# Patient Record
Sex: Female | Born: 1950
Health system: Southern US, Community
[De-identification: ages and names within clinical notes are randomized; demographics above are authoritative.]

## PROBLEM LIST (undated history)

## (undated) DIAGNOSIS — F32A Depression, unspecified: Secondary | ICD-10-CM

## (undated) DIAGNOSIS — R519 Headache, unspecified: Secondary | ICD-10-CM

## (undated) DIAGNOSIS — Z789 Other specified health status: Secondary | ICD-10-CM

## (undated) DIAGNOSIS — Z8489 Family history of other specified conditions: Secondary | ICD-10-CM

## (undated) DIAGNOSIS — F329 Major depressive disorder, single episode, unspecified: Secondary | ICD-10-CM

## (undated) DIAGNOSIS — M1990 Unspecified osteoarthritis, unspecified site: Secondary | ICD-10-CM

## (undated) DIAGNOSIS — C801 Malignant (primary) neoplasm, unspecified: Secondary | ICD-10-CM

## (undated) DIAGNOSIS — Z87442 Personal history of urinary calculi: Secondary | ICD-10-CM

## (undated) DIAGNOSIS — R51 Headache: Secondary | ICD-10-CM

## (undated) HISTORY — PX: KNEE ARTHROSCOPY: SUR90

## (undated) HISTORY — DX: Major depressive disorder, single episode, unspecified: F32.9

## (undated) HISTORY — DX: Depression, unspecified: F32.A

## (undated) HISTORY — PX: DILATION AND CURETTAGE OF UTERUS: SHX78

## (undated) HISTORY — PX: BACK SURGERY: SHX140

---

## 1998-11-07 ENCOUNTER — Other Ambulatory Visit: Admission: RE | Admit: 1998-11-07 | Discharge: 1998-11-07 | Payer: Self-pay | Admitting: Gynecology

## 2000-05-23 ENCOUNTER — Other Ambulatory Visit: Admission: RE | Admit: 2000-05-23 | Discharge: 2000-05-23 | Payer: Self-pay | Admitting: Gynecology

## 2000-06-21 ENCOUNTER — Other Ambulatory Visit: Admission: RE | Admit: 2000-06-21 | Discharge: 2000-06-21 | Payer: Self-pay | Admitting: Gynecology

## 2000-06-21 ENCOUNTER — Encounter (INDEPENDENT_AMBULATORY_CARE_PROVIDER_SITE_OTHER): Payer: Self-pay | Admitting: Specialist

## 2001-12-31 ENCOUNTER — Other Ambulatory Visit: Admission: RE | Admit: 2001-12-31 | Discharge: 2001-12-31 | Payer: Self-pay | Admitting: Gynecology

## 2002-10-12 ENCOUNTER — Encounter: Admission: RE | Admit: 2002-10-12 | Discharge: 2002-10-12 | Payer: Self-pay | Admitting: Family Medicine

## 2002-10-12 ENCOUNTER — Encounter: Payer: Self-pay | Admitting: Family Medicine

## 2003-08-26 ENCOUNTER — Other Ambulatory Visit: Admission: RE | Admit: 2003-08-26 | Discharge: 2003-08-26 | Payer: Self-pay | Admitting: Gynecology

## 2003-12-04 ENCOUNTER — Emergency Department (HOSPITAL_COMMUNITY): Admission: EM | Admit: 2003-12-04 | Discharge: 2003-12-04 | Payer: Self-pay | Admitting: *Deleted

## 2004-09-05 ENCOUNTER — Other Ambulatory Visit: Admission: RE | Admit: 2004-09-05 | Discharge: 2004-09-05 | Payer: Self-pay | Admitting: Gynecology

## 2005-09-25 ENCOUNTER — Other Ambulatory Visit: Admission: RE | Admit: 2005-09-25 | Discharge: 2005-09-25 | Payer: Self-pay | Admitting: Family Medicine

## 2005-12-09 IMAGING — CT CT ABDOMEN W/O CM
1 of 2 series · 15 of 32 positions shown, 19 images · non-contrast
Comparison: none

CLINICAL DATA: Pain.  Question kidney stones.
 CT ABDOMEN WITHOUT CONTRAST ? STONE PROTOCOL
 Multidetector helical study performed without IV and without oral contrast with a renal stone protocol.  
 There is moderate right hydronephrosis.  There are two small 1 to 2 mm sized mid and lower pole left renal calculi noted.  There are no intrarenal right renal calculi.  The abdominal aorta is normal in size.   The remainder of the CT of the abdomen is negative.
 IMPRESSION 
 Moderate right hydronephrosis.   Please see CT pelvis report.
 Small left intrarenal renal calculi.
 CT PELVIS WITHOUT CONTRAST ? STONE PROTOCOL
 There is a 4 mm in size distal right ureteral calculus located at the right ureterovesical junction.  Also seen is a 4 cm in size cyst associated with the left ovary.  This is not well evaluated in terms of simple versus complex cyst and I would recommend transvaginal ultrasound in further evaluation of this to determine if this represents a simple or complex cyst.  Sigmoid colonic diverticulosis noted without evidence for diverticulitis.
 4 mm in size distal right ureteral calculus located at the right ureterovesical junction producing moderate right hydronephrosis.
 4 cm in size left ovarian cyst.  Recommend transvaginal pelvic ultrasound in further evaluation of this lesion.
 Sigmoid colonic diverticulosis.  Please see CT abdomen report as well.

[Series 3: renal stone · axial · 0.66mm/px · z∈[-418,-58]mm · 15 of 75 slices shown, 19 images]
[im 3/75  soft-tissue]
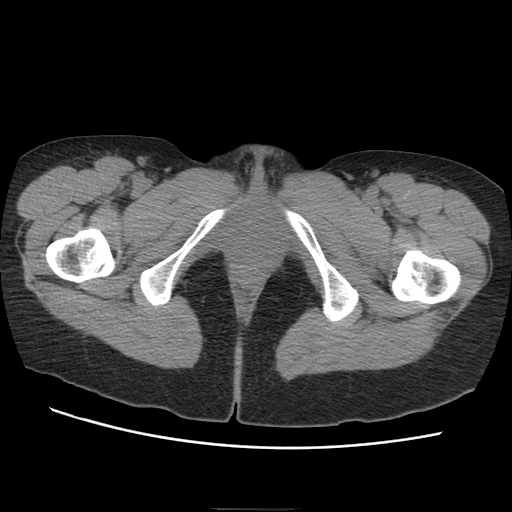
[im 3/75  bone]
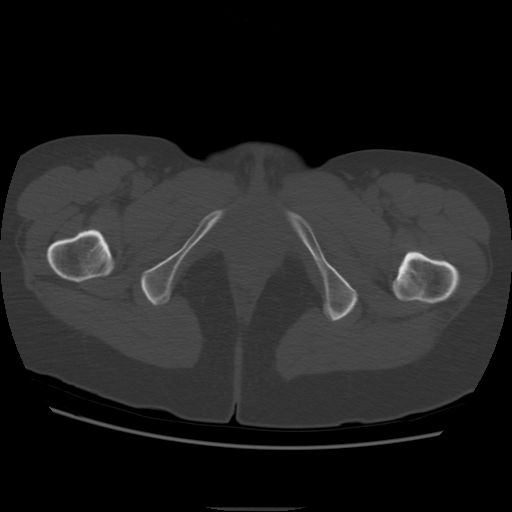
[im 9/75  soft-tissue]
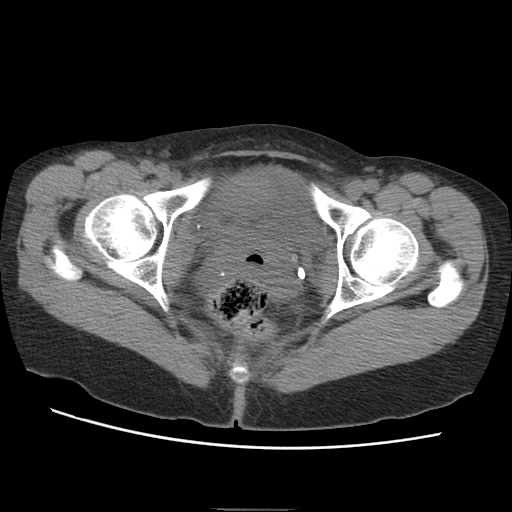
[im 15/75  soft-tissue]
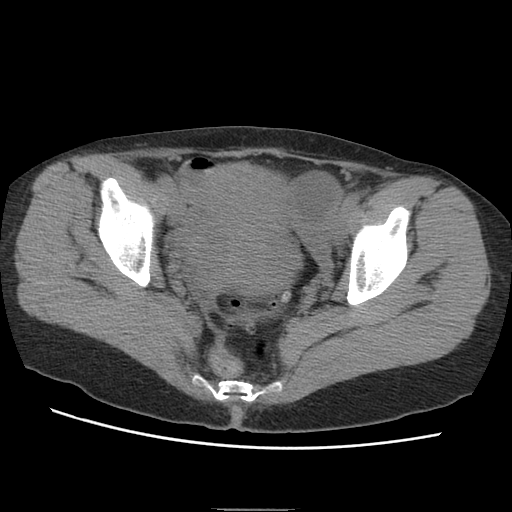
[im 21/75  soft-tissue]
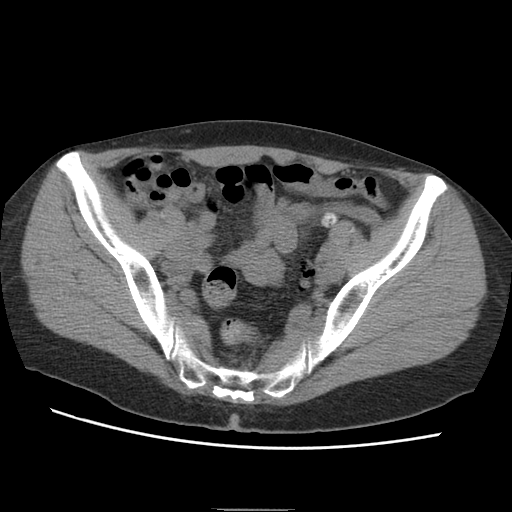
[im 27/75  soft-tissue]
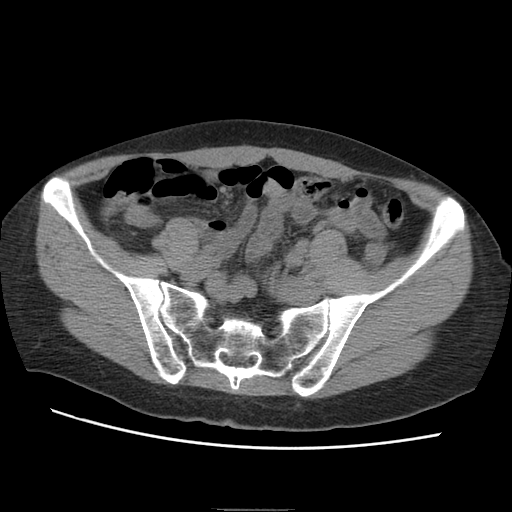
[im 33/75  soft-tissue]
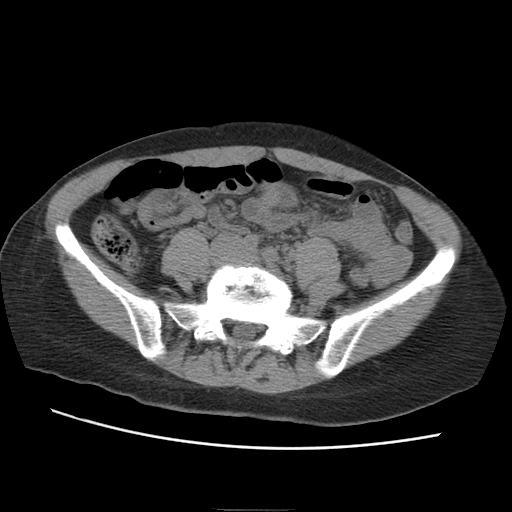
[im 39/75  soft-tissue]
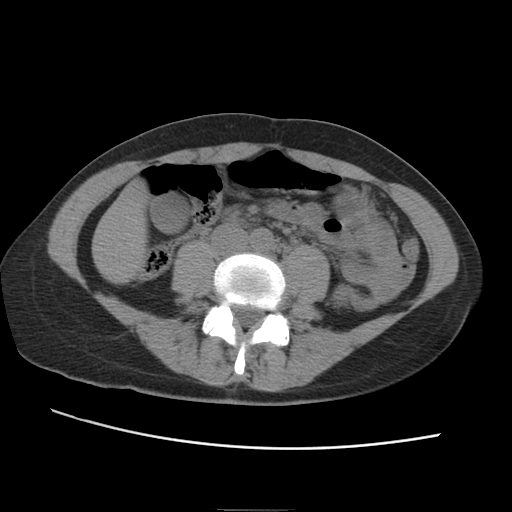
[im 42/75  soft-tissue]
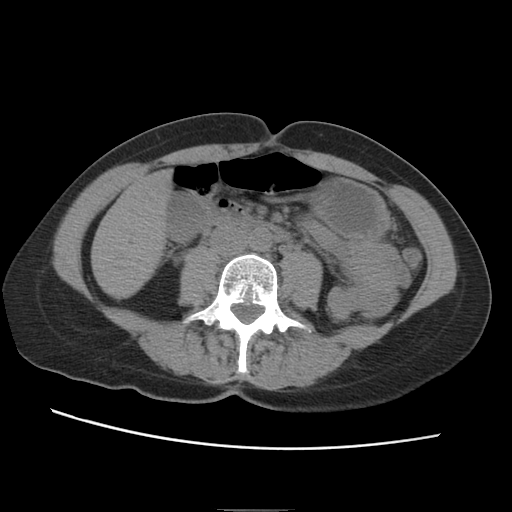
[im 48/75  soft-tissue]
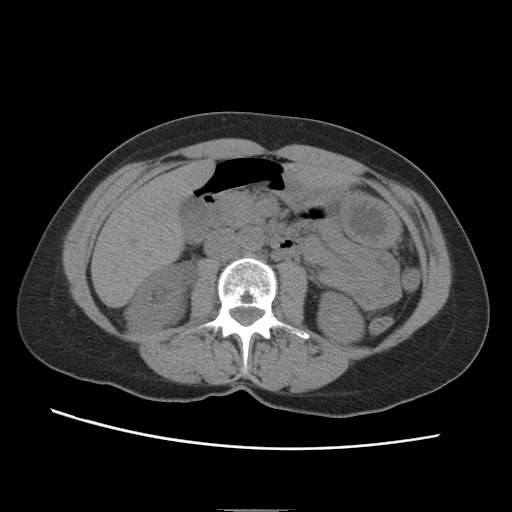
[im 48/75  bone]
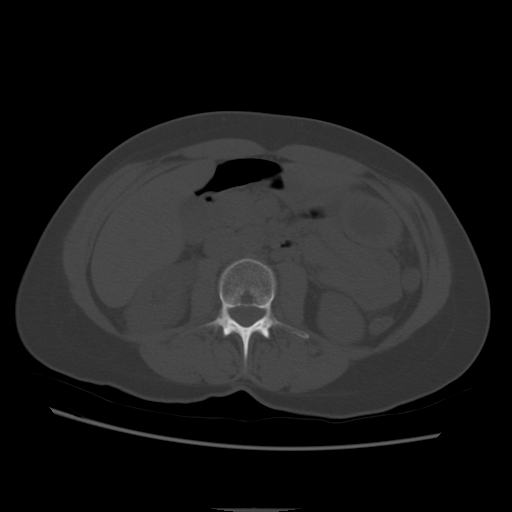
[im 54/75  soft-tissue]
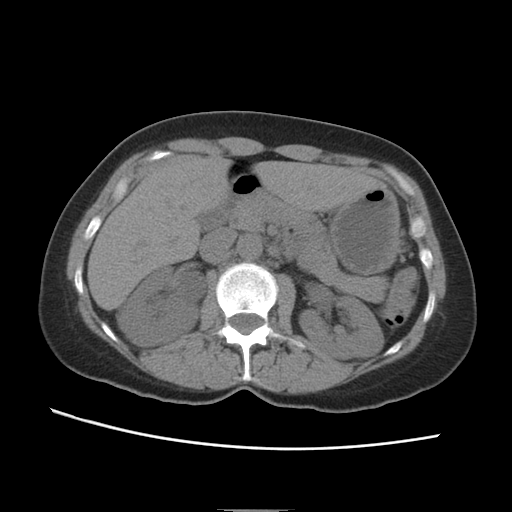
[im 60/75  soft-tissue]
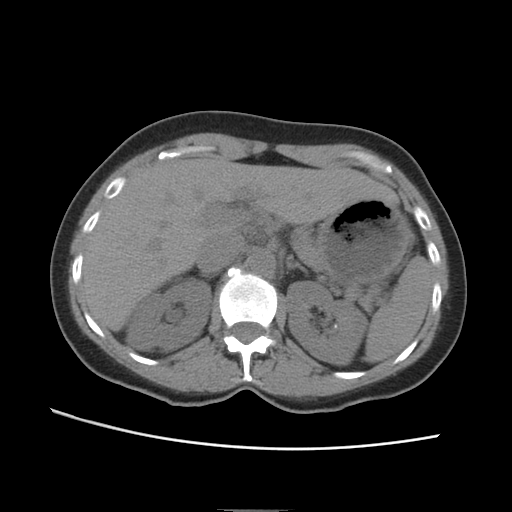
[im 63/75  lung]
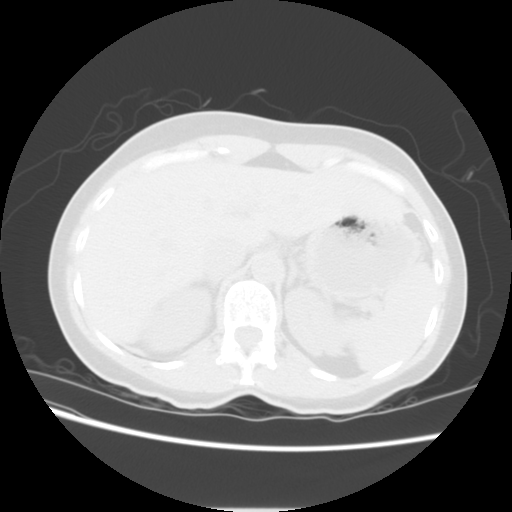
[im 66/75  soft-tissue]
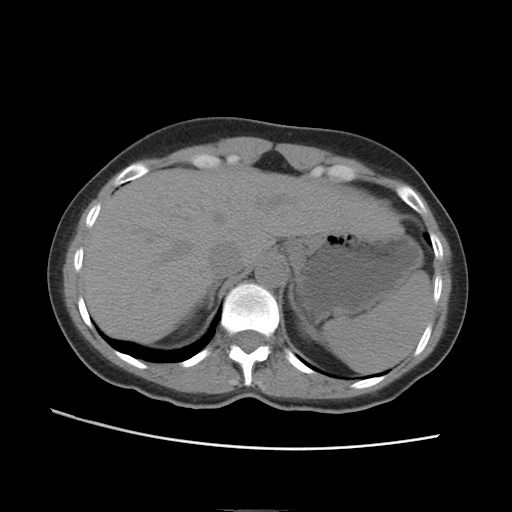
[im 66/75  lung]
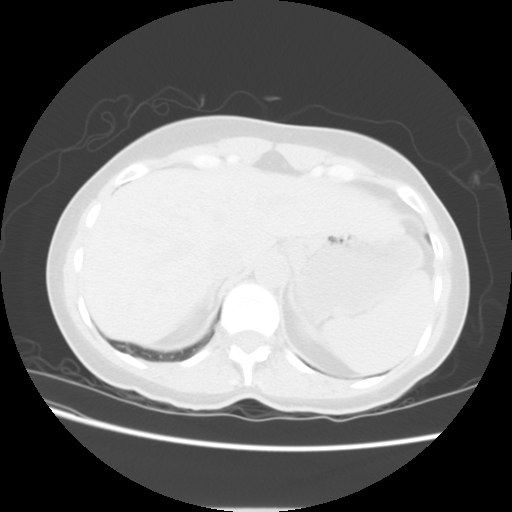
[im 69/75  lung]
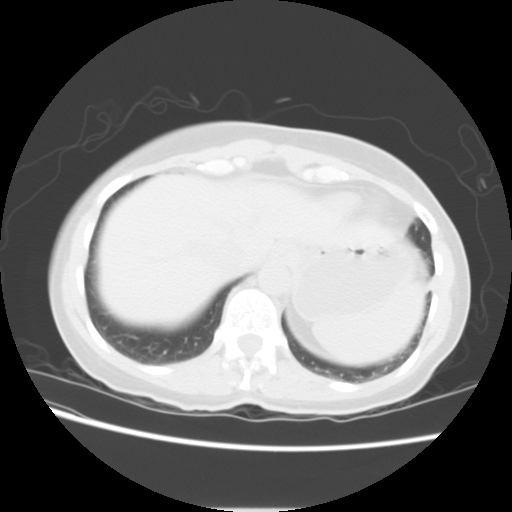
[im 72/75  soft-tissue]
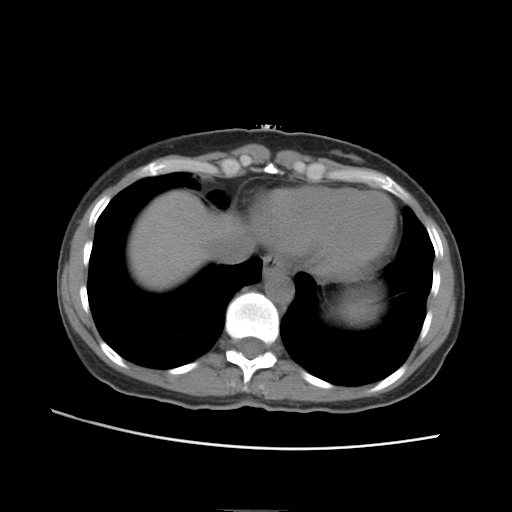
[im 72/75  lung]
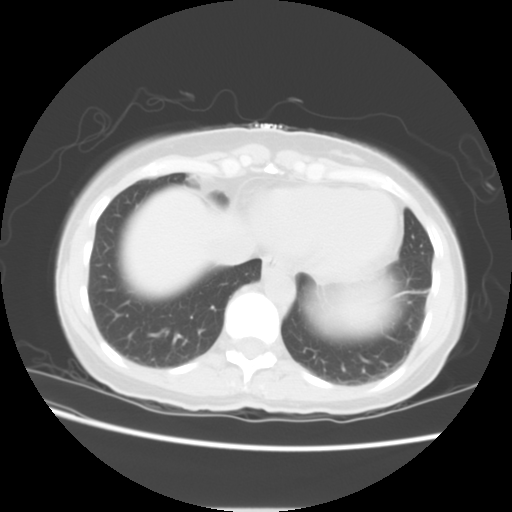

[15 of 32 positions shown; findings below may reference images not displayed]

## 2007-11-18 ENCOUNTER — Other Ambulatory Visit: Admission: RE | Admit: 2007-11-18 | Discharge: 2007-11-18 | Payer: Self-pay | Admitting: Family Medicine

## 2009-01-06 ENCOUNTER — Other Ambulatory Visit: Admission: RE | Admit: 2009-01-06 | Discharge: 2009-01-06 | Payer: Self-pay | Admitting: Family Medicine

## 2010-05-17 ENCOUNTER — Other Ambulatory Visit: Payer: Self-pay | Admitting: Radiology

## 2010-06-06 ENCOUNTER — Other Ambulatory Visit: Payer: Self-pay | Admitting: Family Medicine

## 2010-06-06 ENCOUNTER — Other Ambulatory Visit (HOSPITAL_COMMUNITY)
Admission: RE | Admit: 2010-06-06 | Discharge: 2010-06-06 | Disposition: A | Discharge status: Home / Self Care | Payer: Self-pay | Source: Ambulatory Visit | Attending: Family Medicine | Admitting: Family Medicine

## 2010-06-06 DIAGNOSIS — Z01419 Encounter for gynecological examination (general) (routine) without abnormal findings: Secondary | ICD-10-CM | POA: Insufficient documentation

## 2011-08-27 ENCOUNTER — Encounter (HOSPITAL_BASED_OUTPATIENT_CLINIC_OR_DEPARTMENT_OTHER): Payer: Self-pay | Admitting: *Deleted

## 2011-08-27 NOTE — Progress Notes (Signed)
No labs needed

## 2011-08-29 NOTE — H&P (Signed)
[facility] [address]   [address] [place], [place] [address] [phone] A Division of [facility]  Loreta Ave, M.D.     Robert A. Thurston Hole, M.D.     Lunette Stands, M.D. Eulas Post, M.D.    Buford Dresser, M.D. Estell Harpin, M.D. Ralene Cork, D.O.          Genene Churn. Barry Dienes, PA-C            Kirstin A. Shepperson, PA-C [place], OPA-C   RE: Victoria [name], Victoria [name]                                [phone]      DOB: 1950/07/12 PROGRESS NOTE: 07-27-11 SUBJECTIVE: Victoria [name] is a 61 year-old female, new patient to me who comes to the office with concerns about her right knee.  She fell in October, awkwardly twisted her knee, landing forward and striking the anterior aspect of each knee as she fell.  Seemed to be improving, but then the end of March she fell down in a parking lot twisting and re-injuring her right knee and now it has been troublesome ever since.  There seems to be a piece of bone or cartilage that she can feel in the front of the knee that she moves around.  It is quite tender to palpation.  Some swelling initially, that has now improved.  Her knee gives way.  Minimal symptoms to the left knee, now troublesome symptoms that persist on the right after this recent, recurrent injury.  No previous problems with the knee.  Her medical history upon review shows she has a Penicillin allergy.  No history of diabetes.  No history of hyperlipidemia.  Only medications are hormone replacement therapy.  Review of systems is significant for osteoarthritis.  Past surgical history is significant for remote Cesarean section.  Family history is significant for diabetes and hypertension.  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  OBJECTIVE: She sits comfortably in the exam room.  Height: 5?4.  Weight: 150 pounds.  Blood pressure: 130/82.  Her gait is normal.  She can fully weight bear to the right lower extremity.  Exam  of the hip is unremarkable.  Exam of the right knee shows a calcific loose body in the prepatellar bursa.  Tenderness over the medial joint line.  No effusion.  Tenderness diffusely along the patellar tendon, but she has active extension.  She is neurovascularly intact distally.     X-RAYS: Three views of the right knee show no evidence of a calcific loose body.  A paucity of underlying degenerative changes.   ASSESSMENT: 1. Calcific loose body, prepatellar bursa. 2. Medial meniscus tear.   PLAN: MRI of the right knee.  Follow up after those imaging studies.  Surgical consultation at that time if symptoms persist and warrant.    Estell Harpin, M.D.  [facility]ia [facility] [address]   [address] [place], [place] [address] [phone] A Division of [facility]  Loreta Ave, M.D.     Robert A. Thurston Hole, M.D.     Lunette Stands, M.D. Eulas Post, M.D.    Buford Dresser, M.D. Estell Harpin, M.D. Ralene Cork, D.O.          Genene Churn. Barry Dienes, PA-C  Kirstin A. Shepperson, PA-C [place], OPA-C   RE: Victoria [name], Victoria [name]   [phone]      DOB: [birth year] PROGRESS NOTE: 08-07-11 Victoria [name] is seen for evaluation and treatment recommendation referred by Dr. Farris Has. Right knee symptoms. History is outlined in his notes. She's had 2 twisting injuries and falls one of which striking the anterior aspect of her knee. Initially improved but never resolved. She has extra articular symptoms in front of her patella and some degree of interarticular symptoms. MRI showed cartilaginous loose body tethered in a non-inflamed prepatellar bursa in front of the patella. Marked symptoms when she tries to kneel. X-rays show some degenerative changes but not extreme. MRI shows medial meniscus tear posterior third denuding of articular cartilage in the patellofemoral joint. Other compartments don't look too bad. Two large ossified loose bodies in  the one posteromedial the other anterior.  I looked at her MRI and x-rays. Sufficient symptoms to warrant proceeding with treatment. Remaining history is updated included in the chart. General exam is outlined included in the chart.   EXAMINATION: Healthy 61 year old in no acute distress. Right knee palpable cartilaginous loose body in front of the patella. No enlarged bursa or acute inflammation. She has patellofemoral crepitus some pain with compression not extreme. I can get her through good motion good stability reasonable alignment. Positive medial McMurray's. Ligaments otherwise stable. She's neurovascularly intact distally. Left knee has patellofemoral crepitus but no pain.  DISPOSITION: Discussed workup and treatment to date. Need for intervention straight forward she understands agrees and wants to proceed. Discussed exam under anesthesia arthroscopy care of meniscus tear removal of loose bodies. Paperwork complete. Discussed risks benefits and possible complications in detail. 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  More than 25 minutes spent covering this with her.   Loreta Ave, M.D.  Electronically verified by Loreta Ave, M.D. DFM:kh D 08-07-11 T 08-08-11

## 2011-08-30 ENCOUNTER — Ambulatory Visit (HOSPITAL_BASED_OUTPATIENT_CLINIC_OR_DEPARTMENT_OTHER): Payer: PRIVATE HEALTH INSURANCE | Admitting: Anesthesiology

## 2011-08-30 ENCOUNTER — Encounter (HOSPITAL_BASED_OUTPATIENT_CLINIC_OR_DEPARTMENT_OTHER): Payer: Self-pay | Admitting: Anesthesiology

## 2011-08-30 ENCOUNTER — Encounter (HOSPITAL_BASED_OUTPATIENT_CLINIC_OR_DEPARTMENT_OTHER)
Admission: RE | Disposition: A | Discharge status: Home / Self Care | Payer: Self-pay | Source: Ambulatory Visit | Attending: Orthopedic Surgery

## 2011-08-30 ENCOUNTER — Encounter (HOSPITAL_BASED_OUTPATIENT_CLINIC_OR_DEPARTMENT_OTHER): Payer: Self-pay | Admitting: *Deleted

## 2011-08-30 ENCOUNTER — Ambulatory Visit (HOSPITAL_BASED_OUTPATIENT_CLINIC_OR_DEPARTMENT_OTHER)
Admission: RE | Admit: 2011-08-30 | Discharge: 2011-08-30 | Disposition: A | Discharge status: Home / Self Care | Payer: PRIVATE HEALTH INSURANCE | Source: Ambulatory Visit | Attending: Orthopedic Surgery | Admitting: Orthopedic Surgery

## 2011-08-30 DIAGNOSIS — M1710 Unilateral primary osteoarthritis, unspecified knee: Secondary | ICD-10-CM | POA: Insufficient documentation

## 2011-08-30 DIAGNOSIS — Z4789 Encounter for other orthopedic aftercare: Secondary | ICD-10-CM

## 2011-08-30 DIAGNOSIS — M2340 Loose body in knee, unspecified knee: Secondary | ICD-10-CM | POA: Insufficient documentation

## 2011-08-30 DIAGNOSIS — M7040 Prepatellar bursitis, unspecified knee: Secondary | ICD-10-CM | POA: Insufficient documentation

## 2011-08-30 DIAGNOSIS — M23329 Other meniscus derangements, posterior horn of medial meniscus, unspecified knee: Secondary | ICD-10-CM | POA: Insufficient documentation

## 2011-08-30 DIAGNOSIS — M23359 Other meniscus derangements, posterior horn of lateral meniscus, unspecified knee: Secondary | ICD-10-CM | POA: Insufficient documentation

## 2011-08-30 HISTORY — DX: Unspecified osteoarthritis, unspecified site: M19.90

## 2011-08-30 HISTORY — DX: Other specified health status: Z78.9

## 2011-08-30 LAB — POCT HEMOGLOBIN-HEMACUE: Hemoglobin: 12.6 g/dL (ref 12.0–15.0)

## 2011-08-30 SURGERY — ARTHROSCOPY, KNEE, WITH MEDIAL MENISCECTOMY
Anesthesia: General | Site: Knee | Laterality: Right | Wound class: Clean

## 2011-08-30 MED ORDER — BUPIVACAINE HCL (PF) 0.5 % IJ SOLN
INTRAMUSCULAR | Status: DC | PRN
  Administered 2011-08-30: 20 mL via INTRA_ARTICULAR

## 2011-08-30 MED ORDER — FENTANYL CITRATE 0.05 MG/ML IJ SOLN
INTRAMUSCULAR | Status: DC | PRN
  Administered 2011-08-30: 100 ug via INTRAVENOUS
  Administered 2011-08-30: 25 ug via INTRAVENOUS

## 2011-08-30 MED ORDER — PROPOFOL 10 MG/ML IV EMUL
INTRAVENOUS | Status: DC | PRN
  Administered 2011-08-30: 150 mg via INTRAVENOUS

## 2011-08-30 MED ORDER — MIDAZOLAM HCL 5 MG/5ML IJ SOLN
INTRAMUSCULAR | Status: DC | PRN
  Administered 2011-08-30: 2 mg via INTRAVENOUS

## 2011-08-30 MED ORDER — ONDANSETRON HCL 4 MG/2ML IJ SOLN
INTRAMUSCULAR | Status: DC | PRN
  Administered 2011-08-30: 4 mg via INTRAVENOUS

## 2011-08-30 MED ORDER — HYDROMORPHONE HCL PF 1 MG/ML IJ SOLN
0.2500 mg | INTRAMUSCULAR | Status: DC | PRN
  Administered 2011-08-30 (×2): 0.5 mg via INTRAVENOUS

## 2011-08-30 MED ORDER — VANCOMYCIN HCL IN DEXTROSE 1-5 GM/200ML-% IV SOLN
1000.0000 mg | INTRAVENOUS | Status: AC
  Administered 2011-08-30 (×2): 1000 mg via INTRAVENOUS

## 2011-08-30 MED ORDER — SODIUM CHLORIDE 0.9 % IR SOLN
Status: DC | PRN
  Administered 2011-08-30: 6000 mL

## 2011-08-30 MED ORDER — METHYLPREDNISOLONE ACETATE 80 MG/ML IJ SUSP
INTRAMUSCULAR | Status: DC | PRN
  Administered 2011-08-30: 1 mL via INTRA_ARTICULAR

## 2011-08-30 MED ORDER — LIDOCAINE HCL (CARDIAC) 20 MG/ML IV SOLN
INTRAVENOUS | Status: DC | PRN
  Administered 2011-08-30: 50 mg via INTRAVENOUS

## 2011-08-30 MED ORDER — LACTATED RINGERS IV SOLN
INTRAVENOUS | Status: DC
  Administered 2011-08-30: 07:00:00 via INTRAVENOUS

## 2011-08-30 MED ORDER — KETOROLAC TROMETHAMINE 30 MG/ML IJ SOLN
INTRAMUSCULAR | Status: DC | PRN
  Administered 2011-08-30: 30 mg via INTRAVENOUS

## 2011-08-30 MED ORDER — OXYCODONE-ACETAMINOPHEN 5-325 MG PO TABS
1.0000 | ORAL_TABLET | ORAL | Status: DC | PRN
  Administered 2011-08-30: 1 via ORAL

## 2011-08-30 MED ORDER — DEXAMETHASONE SODIUM PHOSPHATE 4 MG/ML IJ SOLN
INTRAMUSCULAR | Status: DC | PRN
  Administered 2011-08-30: 10 mg via INTRAVENOUS

## 2011-08-30 SURGICAL SUPPLY — 47 items
APL SKNCLS STERI-STRIP NONHPOA (GAUZE/BANDAGES/DRESSINGS) ×1
BANDAGE ELASTIC 6 VELCRO ST LF (GAUZE/BANDAGES/DRESSINGS) ×2 IMPLANT
BENZOIN TINCTURE PRP APPL 2/3 (GAUZE/BANDAGES/DRESSINGS) ×1 IMPLANT
BLADE CUDA 5.5 (BLADE) IMPLANT
BLADE CUDA GRT WHITE 3.5 (BLADE) IMPLANT
BLADE CUTTER GATOR 3.5 (BLADE) ×2 IMPLANT
BLADE CUTTER MENIS 5.5 (BLADE) IMPLANT
BLADE GREAT WHITE 4.2 (BLADE) ×2 IMPLANT
BLADE SURG 15 STRL LF DISP TIS (BLADE) IMPLANT
BLADE SURG 15 STRL SS (BLADE) ×2
BUR OVAL 4.0 (BURR) ×1 IMPLANT
CANISTER OMNI JUG 16 LITER (MISCELLANEOUS) ×2 IMPLANT
CANISTER SUCTION 2500CC (MISCELLANEOUS) IMPLANT
CLOTH BEACON ORANGE TIMEOUT ST (SAFETY) ×2 IMPLANT
CUFF TOURNIQUET SINGLE 34IN LL (TOURNIQUET CUFF) ×1 IMPLANT
CUTTER MENISCUS  4.2MM (BLADE)
CUTTER MENISCUS 4.2MM (BLADE) IMPLANT
DRAPE ARTHROSCOPY W/POUCH 90 (DRAPES) ×2 IMPLANT
DURAPREP 26ML APPLICATOR (WOUND CARE) ×2 IMPLANT
ELECT MENISCUS 165MM 90D (ELECTRODE) IMPLANT
ELECT REM PT RETURN 9FT ADLT (ELECTROSURGICAL) ×2
ELECTRODE REM PT RTRN 9FT ADLT (ELECTROSURGICAL) IMPLANT
GAUZE XEROFORM 1X8 LF (GAUZE/BANDAGES/DRESSINGS) ×2 IMPLANT
GLOVE BIO SURGEON STRL SZ 6.5 (GLOVE) ×1 IMPLANT
GLOVE BIOGEL PI IND STRL 7.0 (GLOVE) IMPLANT
GLOVE BIOGEL PI IND STRL 8 (GLOVE) ×1 IMPLANT
GLOVE BIOGEL PI INDICATOR 7.0 (GLOVE) ×1
GLOVE BIOGEL PI INDICATOR 8 (GLOVE) ×1
GLOVE EXAM NITRILE PF MED BLUE (GLOVE) ×1 IMPLANT
GLOVE ORTHO TXT STRL SZ7.5 (GLOVE) ×4 IMPLANT
GOWN BRE IMP PREV XXLGXLNG (GOWN DISPOSABLE) ×2 IMPLANT
GOWN PREVENTION PLUS XLARGE (GOWN DISPOSABLE) ×2 IMPLANT
HOLDER KNEE FOAM BLUE (MISCELLANEOUS) ×2 IMPLANT
KNEE WRAP E Z 3 GEL PACK (MISCELLANEOUS) ×1 IMPLANT
PACK ARTHROSCOPY DSU (CUSTOM PROCEDURE TRAY) ×2 IMPLANT
PACK BASIN DAY SURGERY FS (CUSTOM PROCEDURE TRAY) ×2 IMPLANT
PENCIL BUTTON HOLSTER BLD 10FT (ELECTRODE) ×1 IMPLANT
SET ARTHROSCOPY TUBING (MISCELLANEOUS) ×2
SET ARTHROSCOPY TUBING LN (MISCELLANEOUS) ×1 IMPLANT
SPONGE GAUZE 4X4 12PLY (GAUZE/BANDAGES/DRESSINGS) ×4 IMPLANT
SPONGE LAP 4X18 X RAY DECT (DISPOSABLE) ×1 IMPLANT
STRIP CLOSURE SKIN 1/2X4 (GAUZE/BANDAGES/DRESSINGS) ×1 IMPLANT
SUT ETHILON 3 0 PS 1 (SUTURE) ×2 IMPLANT
SUT VIC AB 3-0 FS2 27 (SUTURE) IMPLANT
SUT VIC AB 3-0 SH 18 (SUTURE) ×1 IMPLANT
TOWEL OR 17X24 6PK STRL BLUE (TOWEL DISPOSABLE) ×2 IMPLANT
WATER STERILE IRR 1000ML POUR (IV SOLUTION) ×2 IMPLANT

## 2011-08-30 NOTE — Anesthesia Procedure Notes (Signed)
Procedure Name: LMA Insertion Performed by: York Grice Pre-anesthesia Checklist: Patient identified, Patient being monitored, Emergency Drugs available, Timeout performed and Suction available Patient Re-evaluated:Patient Re-evaluated prior to inductionOxygen Delivery Method: Circle system utilized Preoxygenation: Pre-oxygenation with 100% oxygen Intubation Type: IV induction Ventilation: Mask ventilation without difficulty LMA: LMA inserted LMA Size: 4.0 Number of attempts: 1 Placement Confirmation: breath sounds checked- equal and bilateral and positive ETCO2 Tube secured with: Tape Dental Injury: Teeth and Oropharynx as per pre-operative assessment

## 2011-08-30 NOTE — Interval H&P Note (Signed)
History and Physical Interval Note:  08/30/2011 7:43 AM  Victoria [name]  has presented today for surgery, with the diagnosis of right knee mmt, loose body - osteochondral fragment, bursitis prepatellar  The various methods of treatment have been discussed with the patient and family. After consideration of risks, benefits and other options for treatment, the patient has consented to  Procedure(s) (LRB): KNEE ARTHROSCOPY WITH MEDIAL MENISECTOMY (Right) as a surgical intervention .  The patients' history has been reviewed, patient examined, no change in status, stable for surgery.  I have reviewed the patients' chart and labs.  Questions were answered to the patient's satisfaction. Also excision of prepatellar bursa and loose body     Evangelyn Crouse F

## 2011-08-30 NOTE — Anesthesia Postprocedure Evaluation (Signed)
  Anesthesia Post-op Note  Patient: Victoria [name]  Procedure(s) Performed: Procedure(s) (LRB): KNEE ARTHROSCOPY WITH MEDIAL MENISECTOMY (Right)  Patient Location: PACU  Anesthesia Type: General  Level of Consciousness: awake  Airway and Oxygen Therapy: Patient Spontanous Breathing and Patient connected to face mask oxygen  Post-op Pain: mild  Post-op Assessment: Post-op Vital signs reviewed, Patient's Cardiovascular Status Stable, Respiratory Function Stable, Patent Airway and No signs of Nausea or vomiting  Post-op Vital Signs: Reviewed and stable  Complications: No apparent anesthesia complications

## 2011-08-30 NOTE — Discharge Instructions (Signed)

## 2011-08-30 NOTE — Brief Op Note (Signed)
08/30/2011  9:21 AM  PATIENT:  Prescilla Sours  61 y.o. female  PRE-OPERATIVE DIAGNOSIS:  right knee medial meniscus tear loose body - osteochondral fragment, bursitis prepatellar  POST-OPERATIVE DIAGNOSIS:  right knee medial meniscus tear loose body - osteochondral fragment, bursitis prepatellar  PROCEDURE:  Procedure(s) (LRB): KNEE ARTHROSCOPY WITH MEDIAL/lateral  MENISECTOMY (Right), removal loose body, and open prepatellar bursa excision.  SURGEON:  Surgeon(s) and Role:    * Loreta Ave, MD - Primary  PHYSICIAN ASSISTANT: Zonia Kief M     ANESTHESIA:   general  EBL:  Total I/O In: 1300 [I.V.:1300] Out: -    SPECIMEN:  No Specimen  DISPOSITION OF SPECIMEN:  N/A  COUNTS:  YES  TOURNIQUET:   Total Tourniquet Time Documented: Thigh (Right) - 41 minutes PATIENT DISPOSITION:  PACU - hemodynamically stable.

## 2011-08-30 NOTE — Transfer of Care (Signed)
Immediate Anesthesia Transfer of Care Note  Patient: Victoria [name]  Procedure(s) Performed: Procedure(s) (LRB): KNEE ARTHROSCOPY WITH MEDIAL MENISECTOMY (Right)  Patient Location: PACU  Anesthesia Type: General  Level of Consciousness: sedated  Airway & Oxygen Therapy: Patient Spontanous Breathing and Patient connected to face mask oxygen  Post-op Assessment: Report given to PACU RN and Post -op Vital signs reviewed and stable  Post vital signs: Reviewed and stable  Complications: No apparent anesthesia complications

## 2011-08-30 NOTE — Anesthesia Preprocedure Evaluation (Signed)
Anesthesia Evaluation  Patient identified by MRN, date of birth, ID band Patient awake    Reviewed: Allergy & Precautions, H&P , NPO status , Patient's Chart, lab work & pertinent test results  History of Anesthesia Complications (+) AWARENESS UNDER ANESTHESIA  Airway Mallampati: II TM Distance: >3 FB Neck ROM: Full    Dental No notable dental hx. (+) Teeth Intact and Dental Advisory Given   Pulmonary neg pulmonary ROS,  breath sounds clear to auscultation  Pulmonary exam normal       Cardiovascular negative cardio ROS  Rhythm:Regular Rate:Normal     Neuro/Psych negative neurological ROS  negative psych ROS   GI/Hepatic negative GI ROS, Neg liver ROS,   Endo/Other  negative endocrine ROS  Renal/GU negative Renal ROS  negative genitourinary   Musculoskeletal   Abdominal   Peds  Hematology negative hematology ROS (+)   Anesthesia Other Findings   Reproductive/Obstetrics negative OB ROS                           Anesthesia Physical Anesthesia Plan  ASA: I  Anesthesia Plan: General   Post-op Pain Management:    Induction: Intravenous  Airway Management Planned: LMA  Additional Equipment:   Intra-op Plan:   Post-operative Plan: Extubation in OR  Informed Consent: I have reviewed the patients History and Physical, chart, labs and discussed the procedure including the risks, benefits and alternatives for the proposed anesthesia with the patient or authorized representative who has indicated his/her understanding and acceptance.   Dental advisory given  Plan Discussed with: CRNA  Anesthesia Plan Comments:         Anesthesia Quick Evaluation

## 2011-08-31 NOTE — Op Note (Signed)
Victoria [name], Victoria [name]                ACCOUNT NO.:  [id number]  MEDICAL RECORD NO.:  [id number]  LOCATION:                                 FACILITY:  PHYSICIAN:  Loreta Ave, M.D.      DATE OF BIRTH:  DATE OF PROCEDURE:  08/30/2011 DATE OF DISCHARGE:                              OPERATIVE REPORT   PREOPERATIVE DIAGNOSES:  Right knee medial meniscus tear.  Underlying tricompartmental degenerative arthritis.  Loose bodies.  Prepatellar bursitis with a cartilaginous loose body in the prepatellar bursa.  POSTOPERATIVE DIAGNOSES:  Right knee medial meniscus tear.  Underlying tricompartmental degenerative arthritis.  Loose bodies.  Prepatellar bursitis with a cartilaginous loose body in the prepatellar bursa. Extensive tearing of medial meniscus as well as tears in posterior horn of lateral meniscus.  One large osteochondral loose body found posteromedially with a loose bodies/spur anteriorly.  A cartilaginous loose body in the prepatellar bursa.  PROCEDURE:  Right knee exam under anesthesia.  Open excision of prepatellar bursa and loose body from that area.  Arthroscopy with tricompartmental chondroplasty.  Removal of loose bodies and spurs. Partial medial and lateral meniscectomy.  SURGEON:  Loreta Ave, M.D.  ASSISTANT:  Genene Churn. Denton Meek.  ANESTHESIA:  General.  BLOOD LOSS:  Minimal.  SPECIMENS:  None.  COUNTS:  None.  COMPLICATION:  None.  DRESSINGS:  Soft compressive.  TOURNIQUET TIME:  45 minutes.  PROCEDURE:  The patient was brought to operating room and placed on the operating table in supine position.  After adequate anesthesia had been obtained, tourniquet applied, prepped and draped in usual sterile fashion.  Exsanguinated with elevation, Esmarch.  Tourniquet inflated to 350 mmHg.  Longitudinal incision over and in front of the knee.  Skin and subcutaneous tissue divided.  The prepatellar bursa excised in its entirety along with a cartilaginous  loose body found within it. Structures behind that intact.  Wound irrigated and closed subcutaneous with subcuticular Vicryl.  The medial and lateral parapatellar portals created.  Arthroscope introduced, knee distended and inspected. Extensive grade 3 and 4 changes throughout the entire patellofemoral joint, both patella and trochlea.  Chondroplasty on uneven surfaces. Lateral tracking, but really not tethering laterally.  Medial meniscus extensive complex tearing posterior half.  Posterior half removed, tapered into remaining meniscus.  An 11-mm osteochondral loose body found in the back of the knee medially, removed with a large grasping forceps.  Flap tear in posterior third lateral meniscus as well as tearing throughout the midportion all saucerized out and tapered in smoothly.  An extension of the medial tibial plateau spur loose body up in front of the cruciate ligaments was broken off, removed, and a bur was used to contour smoothly.  It was impinging in the notch in full extension.  Cruciate ligaments behind that were intact.  Remaining knee revealed grade 2 and 3 changes in both medial and lateral compartments. All of this treated with chondroplasty.  Not grade 4 in those compartments.  The entire knee examined, no other findings appreciated. Instruments were removed.  Portals then injected with Marcaine.  Portals closed with 4-0 nylon.  Knee injected with Depo-Medrol and Marcaine. Tourniquet deflated and  removed.  Sterile compressive dressing applied. Anesthesia reversed.  Brought to recovery room.  Tolerated surgery well. No complications.     Loreta Ave, M.D.     DFM/MEDQ  D:  08/30/2011  T:  08/31/2011  Job:  [id number]

## 2013-07-07 ENCOUNTER — Other Ambulatory Visit: Payer: Self-pay | Admitting: Family Medicine

## 2013-07-07 ENCOUNTER — Other Ambulatory Visit (HOSPITAL_COMMUNITY)
Admission: RE | Admit: 2013-07-07 | Discharge: 2013-07-07 | Disposition: A | Discharge status: Home / Self Care | Payer: BC Managed Care – PPO | Source: Ambulatory Visit | Attending: Family Medicine | Admitting: Family Medicine

## 2013-07-07 DIAGNOSIS — Z124 Encounter for screening for malignant neoplasm of cervix: Secondary | ICD-10-CM | POA: Insufficient documentation

## 2013-07-07 DIAGNOSIS — Z1151 Encounter for screening for human papillomavirus (HPV): Secondary | ICD-10-CM | POA: Insufficient documentation

## 2016-07-22 DIAGNOSIS — C801 Malignant (primary) neoplasm, unspecified: Secondary | ICD-10-CM

## 2016-07-22 HISTORY — DX: Malignant (primary) neoplasm, unspecified: C80.1

## 2016-08-13 ENCOUNTER — Other Ambulatory Visit: Payer: Self-pay | Admitting: Radiology

## 2016-08-15 ENCOUNTER — Telehealth: Payer: Self-pay | Admitting: *Deleted

## 2016-08-15 NOTE — Telephone Encounter (Signed)
Left vm regarding [place] for 08/22/16. Contact information provided for return call.

## 2016-08-16 ENCOUNTER — Telehealth: Payer: Self-pay | Admitting: *Deleted

## 2016-08-16 NOTE — Telephone Encounter (Signed)
Confirmed [facility] for 08/22/16 at 0815 .  Instructions and contact information given.

## 2016-08-20 ENCOUNTER — Other Ambulatory Visit: Payer: Self-pay | Admitting: *Deleted

## 2016-08-20 ENCOUNTER — Encounter: Payer: Self-pay | Admitting: *Deleted

## 2016-08-20 DIAGNOSIS — C50311 Malignant neoplasm of lower-inner quadrant of right female breast: Secondary | ICD-10-CM | POA: Insufficient documentation

## 2016-08-20 DIAGNOSIS — Z170 Estrogen receptor positive status [ER+]: Principal | ICD-10-CM

## 2016-08-21 ENCOUNTER — Encounter: Payer: Self-pay | Admitting: Radiation Oncology

## 2016-08-21 NOTE — Progress Notes (Signed)
Radiation Oncology         [phone] ________________________________  Name: Victoria [name] MRN: [id number]  Date: 08/22/2016  DOB: 06/27/1950  SR:PRXYVOP,FYTWK, MD  Rolm Bookbinder, MD     REFERRING PHYSICIAN: Rolm Bookbinder, MD   DIAGNOSIS: The encounter diagnosis was Malignant neoplasm of lower-inner quadrant of right breast of female, estrogen receptor positive ([place]).   HISTORY OF PRESENT ILLNESS: Victoria [name] is a 66 y.o. female multidisciplinary breast conference for diagnosis of right breast cancer. The patient was found to have screening detected asymmetry which prompted her to undergo diagnostic imaging. There was a screening detection abnormality in the lower inner quadrant of the right breast at 4:00. Ultrasound measured is abnormality at 4 mm, and her ultrasound of the axilla was negative. She underwent a biopsy on 08/13/16 which revealed a grade 1 invasive ductal carcinoma of the right breast, ER/PR positive, HER-2 negative. Ki-67 was 2%. She comes today to discuss recommendations of care for her breast cancer.  PREVIOUS RADIATION THERAPY: No   PAST MEDICAL HISTORY:  Past Medical History:  Diagnosis Date  . Arthritis   . Depression   . No pertinent past medical history        PAST SURGICAL HISTORY: Past Surgical History:  Procedure Laterality Date  . CESAREAN SECTION     triplets  . DILATION AND CURETTAGE OF UTERUS    . KNEE ARTHROSCOPY Right      FAMILY HISTORY:  Family History  Problem Relation Age of Onset  . Breast cancer Paternal Aunt   . Breast cancer Cousin      SOCIAL HISTORY:  reports that she has never smoked. She has never used smokeless tobacco. She reports that she drinks alcohol. She reports that she does not use drugs. The patient is single and lives in [place]. She is accompanied by her daughter.    ALLERGIES: Darvon [propoxyphene hcl] and Penicillins   MEDICATIONS:  Current Outpatient Prescriptions  Medication Sig  Dispense Refill  . calcium carbonate (OS-CAL) 600 MG TABS Take 600 mg by mouth daily. Calcium + vit D3    . cholecalciferol (VITAMIN D) 1000 UNITS tablet Take 1,000 Units by mouth daily. Takes 2    . clonazePAM (KLONOPIN) 0.5 MG tablet Take 0.5 mg by mouth at bedtime as needed. Takes 0.5 - 0.75 daily as needed    . estradiol (ESTRACE) 2 MG tablet Take 0.5 mg by mouth daily.    Marland Kitchen estrogen-methylTESTOSTERone (ESTRATEST) 1.25-2.5 MG per tablet Take 1 tablet by mouth daily.    Marland Kitchen gabapentin (NEURONTIN) 300 MG capsule Take 1 capsule (300 mg total) by mouth at bedtime. 90 capsule 4  . glucosamine-chondroitin 500-400 MG tablet Take 1 tablet by mouth 3 (three) times daily. Glucosamine '1500mg'$  2x day Chondroitin '1200mg'$  2x day    . medroxyPROGESTERone (PROVERA) 2.5 MG tablet Take 2.5 mg by mouth daily.    . multivitamin-iron-minerals-folic acid (CENTRUM) chewable tablet Chew 1 tablet by mouth daily.    . sertraline (ZOLOFT) 100 MG tablet Take 150 mg by mouth daily.     No current facility-administered medications for this encounter.      REVIEW OF SYSTEMS: On review of systems, the patient reports that she is doing well overall. She denies any chest pain, shortness of breath, cough, fevers, chills, night sweats, unintended weight changes. She denies any bowel or bladder disturbances, and denies abdominal pain, nausea or vomiting. She denies any new musculoskeletal or joint aches or pains. A complete review of systems is  obtained and is otherwise negative.     PHYSICAL EXAM:  Wt Readings from Last 3 Encounters:  08/22/16 149 lb 12.8 oz (67.9 kg)  08/27/11 142 lb (64.4 kg)   Temp Readings from Last 3 Encounters:  08/22/16 98 F (36.7 C) (Oral)  08/30/11 (!) 94 F (34.4 C)   BP Readings from Last 3 Encounters:  08/22/16 (!) 121/59  08/30/11 128/77   Pulse Readings from Last 3 Encounters:  08/22/16 64  08/30/11 68    In general this is a well appearing Caucasian female in no acute distress.  She is alert and oriented x4 and appropriate throughout the examination. HEENT reveals that the patient is normocephalic, atraumatic. EOMs are intact. PERRLA. Skin is intact without any evidence of gross lesions. Cardiovascular exam reveals a regular rate and rhythm, no clicks rubs or murmurs are auscultated. Chest is clear to auscultation bilaterally. Lymphatic assessment is performed and does not reveal any adenopathy in the cervical, supraclavicular, axillary, or inguinal chains. Bilateral breast exam is performed and reveals no palpable mass in the left breast. The right breast reveals palpable induration deep to the biopsy site. No mass is noted, and minimal ecchymosis is seen. No nipple bleeding or discharge is noted of either breast.  Abdomen has active bowel sounds in all quadrants and is intact. The abdomen is soft, non tender, non distended. Lower extremities are negative for pretibial pitting edema, deep calf tenderness, cyanosis or clubbing.   ECOG = 0  0 - Asymptomatic (Fully active, able to carry on all predisease activities without restriction)  1 - Symptomatic but completely ambulatory (Restricted in physically strenuous activity but ambulatory and able to carry out work of a light or sedentary nature. For example, light housework, office work)  2 - Symptomatic, <50% in bed during the day (Ambulatory and capable of all self care but unable to carry out any work activities. Up and about more than 50% of waking hours)  3 - Symptomatic, >50% in bed, but not bedbound (Capable of only limited self-care, confined to bed or chair 50% or more of waking hours)  4 - Bedbound (Completely disabled. Cannot carry on any self-care. Totally confined to bed or chair)  5 - Death   Eustace Pen MM, Creech RH, Tormey DC, et al. [phone]). "Toxicity and response criteria of the [facility]". [place] Oncol. 5 (6): 649-55    LABORATORY DATA:  Lab Results  Component Value Date   WBC  4.4 08/22/2016   HGB 13.0 08/22/2016   HCT 39.1 08/22/2016   MCV 90.1 08/22/2016   PLT 207 08/22/2016   Lab Results  Component Value Date   NA 143 08/22/2016   K 4.3 08/22/2016   CO2 26 08/22/2016   Lab Results  Component Value Date   ALT 29 08/22/2016   AST 29 08/22/2016   ALKPHOS 85 08/22/2016   BILITOT 0.35 08/22/2016      RADIOGRAPHY: No results found.     IMPRESSION/PLAN: 1. Stage IA, T1, N0, MX ER/PR positive grade 1 invasive ductal carcinoma of the right breast. Dr. Lisbeth Renshaw discusses the pathology findings and reviews the nature of invasive ductal breast disease. The consensus from the breast conference include breast conservation with lumpectomy with sentinel mapping.  additional molecular testing with Oncotype would be pursued if the final pathology revealed a tumor of 1 cm or greater. Provided that chemotherapy is not indicated, the patient's course would then be followed by external radiotherapy to the breast followed  by antiestrogen therapy. We discussed the risks, benefits, short, and long term effects of radiotherapy, and the patient is interested in proceeding. Dr. Lisbeth Renshaw discusses the delivery and logistics of radiotherapy and would anticipate a course of 4 weeks of treatment.  We will see her back about 2 weeks after surgery to move forward with the simulation and planning process and anticipate starting radiotherapy about 4 weeks after surgery.    The above documentation reflects my direct findings during this shared patient visit. Please see the separate note by Dr. Lisbeth Renshaw on this date for the remainder of the patient's plan of care.    Carola Rhine, PAC

## 2016-08-22 ENCOUNTER — Encounter: Payer: Self-pay | Admitting: *Deleted

## 2016-08-22 ENCOUNTER — Encounter: Payer: Self-pay | Admitting: Oncology

## 2016-08-22 ENCOUNTER — Encounter: Payer: Self-pay | Admitting: Physical Therapy

## 2016-08-22 ENCOUNTER — Other Ambulatory Visit (HOSPITAL_BASED_OUTPATIENT_CLINIC_OR_DEPARTMENT_OTHER): Payer: BC Managed Care – PPO

## 2016-08-22 ENCOUNTER — Ambulatory Visit
Admission: RE | Admit: 2016-08-22 | Discharge: 2016-08-22 | Disposition: A | Discharge status: Home / Self Care | Payer: BC Managed Care – PPO | Source: Ambulatory Visit | Attending: Radiation Oncology | Admitting: Radiation Oncology

## 2016-08-22 ENCOUNTER — Ambulatory Visit (HOSPITAL_BASED_OUTPATIENT_CLINIC_OR_DEPARTMENT_OTHER): Payer: BC Managed Care – PPO | Admitting: Oncology

## 2016-08-22 ENCOUNTER — Other Ambulatory Visit: Payer: Self-pay | Admitting: General Surgery

## 2016-08-22 ENCOUNTER — Ambulatory Visit: Payer: PRIVATE HEALTH INSURANCE | Attending: General Surgery | Admitting: Physical Therapy

## 2016-08-22 VITALS — BP 121/59 | HR 64 | Temp 98.0°F | Resp 18 | Ht 64.0 in | Wt 149.8 lb

## 2016-08-22 DIAGNOSIS — Z510 Encounter for antineoplastic radiation therapy: Secondary | ICD-10-CM | POA: Insufficient documentation

## 2016-08-22 DIAGNOSIS — Z803 Family history of malignant neoplasm of breast: Secondary | ICD-10-CM | POA: Insufficient documentation

## 2016-08-22 DIAGNOSIS — C50311 Malignant neoplasm of lower-inner quadrant of right female breast: Secondary | ICD-10-CM

## 2016-08-22 DIAGNOSIS — Z79899 Other long term (current) drug therapy: Secondary | ICD-10-CM | POA: Insufficient documentation

## 2016-08-22 DIAGNOSIS — R293 Abnormal posture: Secondary | ICD-10-CM | POA: Insufficient documentation

## 2016-08-22 DIAGNOSIS — Z170 Estrogen receptor positive status [ER+]: Secondary | ICD-10-CM | POA: Insufficient documentation

## 2016-08-22 DIAGNOSIS — Z880 Allergy status to penicillin: Secondary | ICD-10-CM | POA: Insufficient documentation

## 2016-08-22 DIAGNOSIS — M1990 Unspecified osteoarthritis, unspecified site: Secondary | ICD-10-CM | POA: Insufficient documentation

## 2016-08-22 DIAGNOSIS — Z888 Allergy status to other drugs, medicaments and biological substances status: Secondary | ICD-10-CM | POA: Insufficient documentation

## 2016-08-22 DIAGNOSIS — Z9889 Other specified postprocedural states: Secondary | ICD-10-CM | POA: Insufficient documentation

## 2016-08-22 DIAGNOSIS — F329 Major depressive disorder, single episode, unspecified: Secondary | ICD-10-CM | POA: Insufficient documentation

## 2016-08-22 LAB — COMPREHENSIVE METABOLIC PANEL
ALBUMIN: 4.3 g/dL (ref 3.5–5.0)
ALK PHOS: 85 U/L (ref 40–150)
ALT: 29 U/L (ref 0–55)
AST: 29 U/L (ref 5–34)
Anion Gap: 11 mEq/L (ref 3–11)
BUN: 8.7 mg/dL (ref 7.0–26.0)
CALCIUM: 9.3 mg/dL (ref 8.4–10.4)
CO2: 26 mEq/L (ref 22–29)
CREATININE: 0.9 mg/dL (ref 0.6–1.1)
Chloride: 106 mEq/L (ref 98–109)
EGFR: 70 mL/min/{1.73_m2} — ABNORMAL LOW (ref >90)
GLUCOSE: 87 mg/dL (ref 70–140)
POTASSIUM: 4.3 meq/L (ref 3.5–5.1)
SODIUM: 143 meq/L (ref 136–145)
TOTAL PROTEIN: 7.1 g/dL (ref 6.4–8.3)
Total Bilirubin: 0.35 mg/dL (ref 0.20–1.20)

## 2016-08-22 LAB — CBC WITH DIFFERENTIAL/PLATELET
BASO%: 0.8 % (ref 0.0–2.0)
Basophils Absolute: 0 10*3/uL (ref 0.0–0.1)
EOS%: 1.2 % (ref 0.0–7.0)
Eosinophils Absolute: 0.1 10*3/uL (ref 0.0–0.5)
HEMATOCRIT: 39.1 % (ref 34.8–46.6)
HEMOGLOBIN: 13 g/dL (ref 11.6–15.9)
LYMPH#: 1.4 10*3/uL (ref 0.9–3.3)
LYMPH%: 31 % (ref 14.0–49.7)
MCH: 30 pg (ref 25.1–34.0)
MCHC: 33.3 g/dL (ref 31.5–36.0)
MCV: 90.1 fL (ref 79.5–101.0)
MONO#: 0.5 10*3/uL (ref 0.1–0.9)
MONO%: 10.7 % (ref 0.0–14.0)
NEUT%: 56.3 % (ref 38.4–76.8)
NEUTROS ABS: 2.5 10*3/uL (ref 1.5–6.5)
Platelets: 207 10*3/uL (ref 145–400)
RBC: 4.34 10*6/uL (ref 3.70–5.45)
RDW: 13.8 % (ref 11.2–14.5)
WBC: 4.4 10*3/uL (ref 3.9–10.3)

## 2016-08-22 MED ORDER — GABAPENTIN 300 MG PO CAPS: 300.0000 mg | ORAL_CAPSULE | Freq: Every day | ORAL | 4 refills | Status: DC

## 2016-08-22 NOTE — Progress Notes (Signed)
Nutrition Assessment  Reason for Assessment:  Pt seen in Breast Clinic  ASSESSMENT:   66 year old female with new diagnosis of right breast cancer. Past medical history arthritis.     Medications:  reviewed  Labs: reviewed  Anthropometrics:   Height: 64 inches  Weight: 149 lb BMI: 25.8   NUTRITION DIAGNOSIS: Food and nutrition related knowledge deficit related to new diagnosis of breast cancer as evidenced by no prior need for nutrition related information.  INTERVENTION:   Discussed and provided packet of information regarding nutritional tips for breast cancer patients.  Questions answered.  Teachback method used.  Contact information provided and patient knows to contact me with questions/concerns.    MONITORING, EVALUATION, and GOAL: Pt will consume a healthy plant based diet to maintain lean body mass throughout treatment.   Shirin Echeverry B. Zenia Resides, [place], [place] Registered Dietitian [phone] (pager)

## 2016-08-22 NOTE — Patient Instructions (Signed)

## 2016-08-22 NOTE — Progress Notes (Signed)
Clinical Social Work [place] Psychosocial Distress Screening [place]  Patient completed distress screening protocol and scored a 1 on the Psychosocial Distress Thermometer which indicates mild distress. Clinical Social Worker met with patient and patients family in [facility] to assess for distress and other psychosocial needs. Patient stated she was felt positive after meeting with the treatment team and getting more information on her treatment plan. CSW and patient discussed common feeling and emotions when being diagnosed with cancer, and the importance of support during treatment. CSW informed patient of the support team and support services at [facility]. CSW provided contact information and encouraged patient to call with any questions or concerns.  ONCBCN DISTRESS SCREENING 08/22/2016  Screening Type Initial Screening  Distress experienced in past week (1-10) 1  Spiritual/Religous concerns type Facing my mortality  Information Concerns Type Lack of info about diagnosis;Lack of info about treatment;Lack of info about complementary therapy choices;Lack of info about maintaining fitness  Physician notified of physical symptoms Yes     Johnnye Lana, MSW, LCSW, OSW-C Clinical Social Worker [facility] [phone]

## 2016-08-22 NOTE — Therapy (Signed)
[place], [place], [address] Phone: [phone]   Fax:  [phone]  Physical Therapy Evaluation  Patient Details  Name: Victoria [name] MRN: [id number] Date of Birth: 07-11-1950 Referring Provider: Dr. Rolm Bookbinder  Encounter Date: 08/22/2016      PT End of Session - 08/22/16 1032    Visit Number 1   Number of Visits 1   PT Start Time 0939   PT Stop Time 1000  Also saw pt from 1020-1026 for a total of 27 minutes   PT Time Calculation (min) 21 min   Activity Tolerance Patient tolerated treatment well   Behavior During Therapy [facility] for tasks assessed/performed      Past Medical History:  Diagnosis Date  . Arthritis   . Depression   . No pertinent past medical history     Past Surgical History:  Procedure Laterality Date  . CESAREAN SECTION     triplets  . DILATION AND CURETTAGE OF UTERUS    . KNEE ARTHROSCOPY Right     There were no vitals filed for this visit.       Subjective Assessment - 08/22/16 1013    Subjective Patient reports she is here to be seen by her medical team for her newly diagnosed right breast cancer.   Patient is accompained by: Family member   Pertinent History Patient was diagnosed on 08/13/16 with right grade 1 invasive ductal carcinoma breast cancer. It measures 4 mm and is located in the lower inner quadrant, is ER/PR positive and HER2 negative with a Ki67 of 2%.   Patient Stated Goals Reduce lymphedema risk and learn post op shoulder ROM HEP   Currently in Pain? Yes   Pain Score 6    Pain Location Neck   Pain Orientation Right;Left   Pain Descriptors / Indicators Aching   Pain Type Chronic pain   Pain Onset More than a month ago   Pain Frequency Intermittent   Aggravating Factors  stress   Pain Relieving Factors nothing   Multiple Pain Sites No            [facility] PT Assessment - 08/22/16 0001      Assessment   Medical Diagnosis Right breast  cancer   Referring Provider Dr. Rolm Bookbinder   Onset Date/Surgical Date 08/13/16   Hand Dominance Right   Prior Therapy none     Precautions   Precautions Other (comment)   Precaution Comments active cancer     Restrictions   Weight Bearing Restrictions No     Balance Screen   Has the patient fallen in the past 6 months Yes   How many times? 1  Pt. reports this is due to clumsiness; not balance   Has the patient had a decrease in activity level because of a fear of falling?  No   Is the patient reluctant to leave their home because of a fear of falling?  No     Home Environment   Living Environment Private residence   Living Arrangements Spouse/significant other   Available Help at Discharge Family     Prior Function   Level of [place] Full time employment   Journalist, newspaper for court; desk job   Leisure She does gardening     Cognition   Overall Cognitive Status Within Functional Limits for tasks assessed     Posture/Postural Control   Posture/Postural Control Postural limitations   Postural Limitations Rounded  Shoulders;Forward head     ROM / Strength   AROM / PROM / Strength AROM;Strength     AROM   AROM Assessment Site Shoulder;Cervical   Right/Left Shoulder Right;Left   Right Shoulder Extension 53 Degrees   Right Shoulder Flexion 152 Degrees   Right Shoulder ABduction 163 Degrees   Right Shoulder Internal Rotation 62 Degrees   Right Shoulder External Rotation 85 Degrees   Left Shoulder Extension 50 Degrees   Left Shoulder Flexion 147 Degrees   Left Shoulder ABduction 160 Degrees   Left Shoulder Internal Rotation 73 Degrees   Left Shoulder External Rotation 90 Degrees   Cervical Flexion WNL   Cervical Extension WNL   Cervical - Right Side Bend 25% limited   Cervical - Left Side Bend 25% limited   Cervical - Right Rotation 25% limited   Cervical - Left Rotation 25% limited     Strength   Overall  Strength Within functional limits for tasks performed           LYMPHEDEMA/ONCOLOGY QUESTIONNAIRE - 08/22/16 1030      Type   Cancer Type Right breast cancer     Lymphedema Assessments   Lymphedema Assessments Upper extremities     Right Upper Extremity Lymphedema   10 cm Proximal to Olecranon Process 27.6 cm   Olecranon Process 23.9 cm   10 cm Proximal to Ulnar Styloid Process 20.4 cm   Just Proximal to Ulnar Styloid Process 15 cm   Across Hand at [employer] 17.9 cm   At [place] of 2nd Digit 6.2 cm     Left Upper Extremity Lymphedema   10 cm Proximal to Olecranon Process 26.6 cm   Olecranon Process 23.1 cm   10 cm Proximal to Ulnar Styloid Process 19.2 cm   Just Proximal to Ulnar Styloid Process 14.1 cm   Across Hand at [employer] 17.5 cm   At [place] of 2nd Digit 5.9 cm           Patient was instructed today in a home exercise program today for post op shoulder range of motion. These included active assist shoulder flexion in sitting, scapular retraction, wall walking with shoulder abduction, and hands behind head external rotation.  She was encouraged to do these twice a day, holding 3 seconds and repeating 5 times when permitted by her physician.                 PT Education - 08/22/16 1031    Education provided Yes   Education Details Lymphedema risk reduction and post op shoulder ROM HEP   Person(s) Educated Patient;Child(ren)   Methods Explanation;Demonstration;Handout   Comprehension Returned demonstration;Verbalized understanding              Breast Clinic Goals - 08/22/16 1104      Patient will be able to verbalize understanding of pertinent lymphedema risk reduction practices relevant to her diagnosis specifically related to skin care.   Time 1   Period Days   Status Achieved     Patient will be able to return demonstrate and/or verbalize understanding of the post-op home exercise program related to regaining shoulder range of  motion.   Time 1   Period Days   Status Achieved     Patient will be able to verbalize understanding of the importance of attending the postoperative After Breast Cancer Class for further lymphedema risk reduction education and therapeutic exercise.   Time 1   Period Days   Status  Achieved              Plan - 08/22/16 1032    Clinical Impression Statement Patient was diagnosed on 08/13/16 with right grade 1 invasive ductal carcinoma breast cancer. It measures 4 mm and is located in the lower inner quadrant, is ER/PR positive and HER2 negative with a Ki67 of 2%. Her multidisciplinary medical team met prior to her assessments to determine a recommended treatment plan. She is planning to have a right lumpectomy and sentinel node biopsy followed by radiation and anti-estrogen therapy. She may benefit from post op PT to regain shoulder ROM and reduce lymphedema risk. Due to her lack of comorbidities, her eval is low complexity.   Rehab Potential Excellent   Clinical Impairments Affecting Rehab Potential None   PT Frequency One time visit   PT Treatment/Interventions Patient/family education;Therapeutic exercise   PT Next Visit Plan Will f/u after surgery to determine PT needs   PT Home Exercise Plan Post op shoulder ROM HEP   Consulted and Agree with Plan of Care Patient;Family member/caregiver   Family Member Consulted daughter      Patient will benefit from skilled therapeutic intervention in order to improve the following deficits and impairments:  Postural dysfunction, Decreased knowledge of precautions, Pain, Impaired UE functional use, Decreased range of motion  Visit Diagnosis: Carcinoma of lower-inner quadrant of right breast in female, estrogen receptor positive ([place]) - Plan: PT plan of care cert/re-cert  Abnormal posture - Plan: PT plan of care cert/re-cert   Patient will follow up at outpatient cancer rehab if needed following surgery.  If the patient requires physical  therapy at that time, a specific plan will be dictated and sent to the referring physician for approval. The patient was educated today on appropriate basic range of motion exercises to begin post operatively and the importance of attending the After Breast Cancer class following surgery.  Patient was educated today on lymphedema risk reduction practices as it pertains to recommendations that will benefit the patient immediately following surgery.  She verbalized good understanding.  No additional physical therapy is indicated at this time.     Problem List Patient Active Problem List   Diagnosis Date Noted  . Malignant neoplasm of lower-inner quadrant of right breast of female, estrogen receptor positive ([place]) 08/20/2016    Annia Friendly, PT 08/22/16 11:07 AM   [place], [place], [address] Phone: [phone]   Fax:  [phone]  Name: Victoria [name] MRN: [id number] Date of Birth: [birth year]

## 2016-08-22 NOTE — Progress Notes (Signed)
[place]  [phone] [phone]     ID: Victoria [name] DOB: 06/23/1950  MR#: [id number]  BPZ#:[id number]  Patient Care Team: Cari Caraway, MD as PCP - General (Family Medicine) Chauncey Cruel, MD as Consulting Physician (Oncology) Rolm Bookbinder, MD as Consulting Physician (General Surgery) Kyung Rudd, MD as Consulting Physician (Radiation Oncology) Christene Slates, MD as Physician Assistant (Radiology) Chauncey Cruel, MD OTHER MD:  CHIEF COMPLAINT: Estrogen receptor positive breast cancer  CURRENT TREATMENT: Awaiting definitive surgery   BREAST CANCER HISTORY: Victoria [name] had routine bilateral screening mammography at [facility] 07/20/2016. The breast density was category B. In the right breast lower inner quadrant there was an oval mass associated with architectural distortion. On 08/02/2016 the patient was brought back for further imaging. Right diagnostic mammography confirmed a 0.5 cm all nodule in the right breast at the 4:00 radiant. By ultrasonography this measured 0.4 cm and had lobulated margins.  Biopsy of the right breast mass in question 08/13/2016 showed ([id number]) invasive ductal carcinoma, grade 1, estrogen receptor 100% positive, progesterone receptor 100% positive, both with strong staining intensity, with an MIB-1 of 2%, and no HER-2 notification, the signals ratio being 1.27 and the number per cell 2.10.  The patient's subsequent history is as detailed below  INTERVAL HISTORY: Victoria [name] was evaluated in the multidisciplinary breast cancer clinic 08/22/2016 accompanied by her daughter Victoria [name]. Her case was also presented in the multidisciplinary breast cancer conference that same morning. At that time a preliminary plan was proposed: Breast conserving surgery, consideration of anti-estrogens, adjuvant radiation  REVIEW OF SYSTEMS: There were no specific symptoms leading to the original mammogram, which was routinely scheduled. The  patient denies unusual headaches, visual changes, nausea, vomiting, stiff neck, dizziness, or gait imbalance. There has been no cough, phlegm production, or pleurisy, no chest pain or pressure, and no change in bowel or bladder habits. The patient denies fever, rash, bleeding, unexplained fatigue or unexplained weight loss. She admits to mild arthralgias and myalgias here and there which are not more intense or persistent than prior A detailed review of systems was otherwise entirely negative.   PAST MEDICAL HISTORY: Past Medical History:  Diagnosis Date  . Arthritis   . Depression   . No pertinent past medical history     PAST SURGICAL HISTORY: Past Surgical History:  Procedure Laterality Date  . CESAREAN SECTION     triplets  . DILATION AND CURETTAGE OF UTERUS    . KNEE ARTHROSCOPY Right     FAMILY HISTORY Family History  Problem Relation Age of Onset  . Breast cancer Paternal Aunt   . Breast cancer Cousin   The patient's father died from complications of emphysema at age 55. The patient's mother died following a stroke at age 92. The patient had no brothers, 1 sister. A paternal aunt had breast cancer in her 31s and a paternal second cousin breast cancer in her 10s. There is no history of ovarian cancer in the family  GYNECOLOGIC HISTORY:  No LMP recorded. Patient is postmenopausal. Menarche age 89, first live birth age 22. The patient is GX P3. She stopped having periods in 2004. She took hormone replacement until 2018, when she was diagnosed with breast cancer.  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 Her husband Victoria [name] is a Games developer. Her children, from a prior marriage, are Victoria [name] who lives in [place] and is an Chief Financial Officer, Victoria [name], who is a  teaching assistant in the [place] literature PhD program at [employer] (and whose husband is from [place]), and Victoria [name], who works as a 0 keep her in [place].    ADVANCED DIRECTIVES: The patient tells me she has named her daughter Victoria [name] as her healthcare power of attorney. Victoria [name] can be contacted at [phone].   HEALTH MAINTENANCE: Social History  Substance Use Topics  . Smoking status: Never Smoker  . Smokeless tobacco: Never Used  . Alcohol use Yes     Comment: occ     Colonoscopy: 2016/[facility]     PAP:  Bone density: 2013   Allergies  Allergen Reactions  . Darvon [Propoxyphene Hcl]     hyperactivity  . Penicillins Itching    Current Outpatient Prescriptions  Medication Sig Dispense Refill  . calcium carbonate (OS-CAL) 600 MG TABS Take 600 mg by mouth daily. Calcium + vit D3    . cholecalciferol (VITAMIN D) 1000 UNITS tablet Take 1,000 Units by mouth daily. Takes 2    . clonazePAM (KLONOPIN) 0.5 MG tablet Take 0.5 mg by mouth at bedtime as needed. Takes 0.5 - 0.75 daily as needed    . estradiol (ESTRACE) 2 MG tablet Take 0.5 mg by mouth daily.    Marland Kitchen glucosamine-chondroitin 500-400 MG tablet Take 1 tablet by mouth 3 (three) times daily. Glucosamine 1573m 2x day Chondroitin 12075m2x day    . medroxyPROGESTERone (PROVERA) 2.5 MG tablet Take 2.5 mg by mouth daily.    . multivitamin-iron-minerals-folic acid (CENTRUM) chewable tablet Chew 1 tablet by mouth daily.    . sertraline (ZOLOFT) 100 MG tablet Take 150 mg by mouth daily.    . Marland Kitchenstrogen-methylTESTOSTERone (ESTRATEST) 1.25-2.5 MG per tablet Take 1 tablet by mouth daily.    . Marland Kitchenabapentin (NEURONTIN) 300 MG capsule Take 1 capsule (300 mg total) by mouth at bedtime. 90 capsule 4   No current facility-administered medications for this visit.     OBJECTIVE: Middle-aged white woman who appears stated age  Vi36  08/22/16 0842  BP: (!) 121/59  Pulse: 64  Resp: 18  Temp: 98 F (36.7 C)     Body mass index is 25.71 kg/m.    ECOG FS:0 - Asymptomatic  Ocular: Sclerae unicteric, pupils equal, round and reactive to light Ear-nose-throat: Oropharynx  clear and moist Lymphatic: No cervical or supraclavicular adenopathy Lungs no rales or rhonchi, good excursion bilaterally Heart regular rate and rhythm, no murmur appreciated Abd soft, nontender, positive bowel sounds MSK no focal spinal tenderness, no joint edema Neuro: non-focal, well-oriented, appropriate affect Breasts: The right breast is status post recent biopsy. I do not palpate a mass and there are no skin or nipple changes of concern. The left breast is unremarkable. Both axillae are benign.   LAB RESULTS:  CMP     Component Value Date/Time   NA 143 08/22/2016 0831   K 4.3 08/22/2016 0831   CO2 26 08/22/2016 0831   GLUCOSE 87 08/22/2016 0831   BUN 8.7 08/22/2016 0831   CREATININE 0.9 08/22/2016 0831   CALCIUM 9.3 08/22/2016 0831   PROT 7.1 08/22/2016 0831   ALBUMIN 4.3 08/22/2016 0831   AST 29 08/22/2016 0831   ALT 29 08/22/2016 0831   ALKPHOS 85 08/22/2016 0831   BILITOT 0.35 08/22/2016 0831    No results found for: TOTALPROTELP, ALBUMINELP, A1GS, A2GS, BETS, BETA2SER, GAMS, MSPIKE, SPEI  No results found for: KPAFRELGTCHN, LAMBDASER, KAPLAMBRATIO  Lab Results  Component Value Date   WBC 4.4  08/22/2016   NEUTROABS 2.5 08/22/2016   HGB 13.0 08/22/2016   HCT 39.1 08/22/2016   MCV 90.1 08/22/2016   PLT 207 08/22/2016      Chemistry      Component Value Date/Time   NA 143 08/22/2016 0831   K 4.3 08/22/2016 0831   CO2 26 08/22/2016 0831   BUN 8.7 08/22/2016 0831   CREATININE 0.9 08/22/2016 0831      Component Value Date/Time   CALCIUM 9.3 08/22/2016 0831   ALKPHOS 85 08/22/2016 0831   AST 29 08/22/2016 0831   ALT 29 08/22/2016 0831   BILITOT 0.35 08/22/2016 0831       No results found for: LABCA2  No components found for: QASTMH962  No results for input(s): INR in the last 168 hours.  Urinalysis No results found for: COLORURINE, APPEARANCEUR, LABSPEC, PHURINE, GLUCOSEU, HGBUR, BILIRUBINUR, KETONESUR, PROTEINUR, UROBILINOGEN, NITRITE,  LEUKOCYTESUR   STUDIES: Outside mammography reviewed with the patient  ELIGIBLE FOR AVAILABLE RESEARCH PROTOCOL: No  ASSESSMENT: 66 y.o. [place] woman status post right breast lower inner quadrant biopsy 08/13/2016 for a clinical T1a N0, stage IA invasive ductal carcinoma, grade 1, estrogen and progesterone receptor positive, HER-2 not amplified, with an MIB-1 of 2%  (1) breast conserving surgery depending  (2) adjuvant radiation to follow  (3) consider anti-estrogens at the completion of local treatment  PLAN: We spent the better part of today's hour-long appointment discussing the biology of breast cancer in general, and the specifics of the patient's tumor in particular. We first reviewed the fact that cancer is not one disease but more than 100 different diseases and that it is important to keep them separate-- otherwise when friends and relatives discuss their own cancer experiences with Mileydi confusion can result. Similarly we explained that if breast cancer spreads to the bone or liver, the patient would not have bone cancer or liver cancer, but breast cancer in the bone and breast cancer in the liver: one cancer in three places-- not 3 different cancers which otherwise would have to be treated in 3 different ways.  We discussed the difference between local and systemic therapy. In terms of loco-regional treatment, lumpectomy plus radiation is equivalent to mastectomy as far as survival is concerned. For this reason, and because the cosmetic results are generally superior, we recommend breast conserving surgery.   We then discussed the rationale for systemic therapy. There is some risk that this cancer may have already spread to other parts of her body. Patients frequently ask at this point about bone scans, CAT scans and PET scans to find out if they have occult breast cancer somewhere else. The problem is that in early stage disease we are much more likely to find false  positives then true cancers and this would expose the patient to unnecessary procedures as well as unnecessary radiation. Scans cannot answer the question the patient really would like to know, which is whether she has microscopic disease elsewhere in her body. For those reasons we do not recommend them.  Of course we would proceed to aggressive evaluation of any symptoms that might suggest metastatic disease, but that is not the case here.  Next we went over the options for systemic therapy which are anti-estrogens, anti-HER-2 immunotherapy, and chemotherapy. [place] does not meet criteria for anti-HER-2 immunotherapy. She is a good candidate for anti-estrogens, although if the size of the cancer is as small as it appears on imaging, the use of anti-estrogens may be more prophylactic then  for treatment..  The question of chemotherapy is more complicated. Chemotherapy is most effective in rapidly growing, aggressive tumors. It is much less effective in low-grade, slow growing cancers, like Nathifa 's. Furthermore chemotherapy is not recommended by NCCN guidelines in T1a cases. Finally in our own review we have found grade 1 T1b N0 cases to be uniformly low risk or low intermediate risk on the Oncotype DX test. Accordingly we do not anticipate setting an Oncotype on this patient and we do not anticipate her requiring chemotherapy. These decisions of course assume the final pathology will be concordant with the initial imaging.  The overall plan then is for breast conserving surgery followed by radiation, then consideration of anti-estrogens.  Sandeep has a good understanding of the overall plan. She agrees with it. She knows the goal of treatment in her case is cure. She will call with any problems that may develop before her next visit here.  Chauncey Cruel, MD   08/23/2016 8:00 AM Medical Oncology and Hematology [facility] [address] [place], [place] [address] Tel. [phone]     Fax. [phone]

## 2016-08-27 ENCOUNTER — Telehealth: Payer: Self-pay | Admitting: *Deleted

## 2016-08-27 NOTE — Telephone Encounter (Signed)
Left vm regarding [facility] from 08/22/16 to assess needs and questions. Contact information provided for return call.

## 2016-08-28 ENCOUNTER — Encounter (HOSPITAL_BASED_OUTPATIENT_CLINIC_OR_DEPARTMENT_OTHER): Payer: Self-pay | Admitting: *Deleted

## 2016-08-28 ENCOUNTER — Encounter: Payer: Self-pay | Admitting: Radiation Oncology

## 2016-08-30 NOTE — Progress Notes (Signed)
Boost drink given with instructions to complete by 5300 dos, Hibliclens soap given with instructions, pt verbalized understanding.

## 2016-09-03 ENCOUNTER — Encounter (HOSPITAL_COMMUNITY)
Admission: RE | Admit: 2016-09-03 | Discharge: 2016-09-03 | Disposition: A | Discharge status: Home / Self Care | Payer: BC Managed Care – PPO | Source: Ambulatory Visit | Attending: General Surgery | Admitting: General Surgery

## 2016-09-03 ENCOUNTER — Encounter (HOSPITAL_BASED_OUTPATIENT_CLINIC_OR_DEPARTMENT_OTHER): Payer: Self-pay

## 2016-09-03 ENCOUNTER — Ambulatory Visit (HOSPITAL_BASED_OUTPATIENT_CLINIC_OR_DEPARTMENT_OTHER)
Admission: RE | Admit: 2016-09-03 | Discharge: 2016-09-03 | Disposition: A | Discharge status: Home / Self Care | Payer: BC Managed Care – PPO | Source: Ambulatory Visit | Attending: General Surgery | Admitting: General Surgery

## 2016-09-03 ENCOUNTER — Ambulatory Visit (HOSPITAL_BASED_OUTPATIENT_CLINIC_OR_DEPARTMENT_OTHER): Payer: BC Managed Care – PPO | Admitting: Anesthesiology

## 2016-09-03 ENCOUNTER — Encounter (HOSPITAL_BASED_OUTPATIENT_CLINIC_OR_DEPARTMENT_OTHER)
Admission: RE | Disposition: A | Discharge status: Home / Self Care | Payer: Self-pay | Source: Ambulatory Visit | Attending: General Surgery

## 2016-09-03 DIAGNOSIS — C50311 Malignant neoplasm of lower-inner quadrant of right female breast: Secondary | ICD-10-CM | POA: Diagnosis present

## 2016-09-03 DIAGNOSIS — Z170 Estrogen receptor positive status [ER+]: Principal | ICD-10-CM

## 2016-09-03 DIAGNOSIS — Z880 Allergy status to penicillin: Secondary | ICD-10-CM | POA: Diagnosis not present

## 2016-09-03 HISTORY — PX: BREAST LUMPECTOMY WITH RADIOACTIVE SEED AND SENTINEL LYMPH NODE BIOPSY: SHX6550

## 2016-09-03 HISTORY — DX: Malignant (primary) neoplasm, unspecified: C80.1

## 2016-09-03 SURGERY — BREAST LUMPECTOMY WITH RADIOACTIVE SEED AND SENTINEL LYMPH NODE BIOPSY
Anesthesia: General | Laterality: Right

## 2016-09-03 MED ORDER — LIDOCAINE 2% (20 MG/ML) 5 ML SYRINGE
INTRAMUSCULAR | Status: AC
  Filled 2016-09-03: qty 5

## 2016-09-03 MED ORDER — SCOPOLAMINE 1 MG/3DAYS TD PT72: 1.0000 | MEDICATED_PATCH | Freq: Once | TRANSDERMAL | Status: DC | PRN

## 2016-09-03 MED ORDER — LIDOCAINE HCL (CARDIAC) 20 MG/ML IV SOLN
INTRAVENOUS | Status: DC | PRN
  Administered 2016-09-03: 60 mg via INTRAVENOUS

## 2016-09-03 MED ORDER — ONDANSETRON HCL 4 MG/2ML IJ SOLN: 4.0000 mg | Freq: Once | INTRAMUSCULAR | Status: DC | PRN

## 2016-09-03 MED ORDER — ONDANSETRON HCL 4 MG/2ML IJ SOLN
INTRAMUSCULAR | Status: DC | PRN
  Administered 2016-09-03: 4 mg via INTRAVENOUS

## 2016-09-03 MED ORDER — CIPROFLOXACIN IN D5W 400 MG/200ML IV SOLN
INTRAVENOUS | Status: AC
  Filled 2016-09-03: qty 200

## 2016-09-03 MED ORDER — GABAPENTIN 300 MG PO CAPS
ORAL_CAPSULE | ORAL | Status: AC
  Filled 2016-09-03: qty 1

## 2016-09-03 MED ORDER — LACTATED RINGERS IV SOLN
INTRAVENOUS | Status: DC
  Administered 2016-09-03: 10:00:00 via INTRAVENOUS

## 2016-09-03 MED ORDER — ACETAMINOPHEN 500 MG PO TABS
ORAL_TABLET | ORAL | Status: AC
  Filled 2016-09-03: qty 2

## 2016-09-03 MED ORDER — BUPIVACAINE-EPINEPHRINE (PF) 0.5% -1:200000 IJ SOLN
INTRAMUSCULAR | Status: DC | PRN
  Administered 2016-09-03: 30 mL via PERINEURAL

## 2016-09-03 MED ORDER — PROPOFOL 10 MG/ML IV BOLUS
INTRAVENOUS | Status: AC
  Filled 2016-09-03: qty 20

## 2016-09-03 MED ORDER — DEXAMETHASONE SODIUM PHOSPHATE 10 MG/ML IJ SOLN
INTRAMUSCULAR | Status: AC
  Filled 2016-09-03: qty 1

## 2016-09-03 MED ORDER — FENTANYL CITRATE (PF) 100 MCG/2ML IJ SOLN
50.0000 ug | INTRAMUSCULAR | Status: DC | PRN
  Administered 2016-09-03: 50 ug via INTRAVENOUS

## 2016-09-03 MED ORDER — MIDAZOLAM HCL 2 MG/2ML IJ SOLN
1.0000 mg | INTRAMUSCULAR | Status: DC | PRN
  Administered 2016-09-03: 2 mg via INTRAVENOUS

## 2016-09-03 MED ORDER — OXYCODONE-ACETAMINOPHEN 10-325 MG PO TABS: 1.0000 | ORAL_TABLET | Freq: Four times a day (QID) | ORAL | 0 refills | Status: DC | PRN

## 2016-09-03 MED ORDER — BUPIVACAINE HCL (PF) 0.25 % IJ SOLN
INTRAMUSCULAR | Status: DC | PRN
  Administered 2016-09-03: 6 mL

## 2016-09-03 MED ORDER — MIDAZOLAM HCL 2 MG/2ML IJ SOLN
INTRAMUSCULAR | Status: AC
  Filled 2016-09-03: qty 2

## 2016-09-03 MED ORDER — EPHEDRINE SULFATE 50 MG/ML IJ SOLN
INTRAMUSCULAR | Status: DC | PRN
  Administered 2016-09-03: 10 mg via INTRAVENOUS

## 2016-09-03 MED ORDER — KETOROLAC TROMETHAMINE 30 MG/ML IJ SOLN
INTRAMUSCULAR | Status: DC | PRN
  Administered 2016-09-03: 15 mg via INTRAVENOUS

## 2016-09-03 MED ORDER — PROPOFOL 10 MG/ML IV BOLUS
INTRAVENOUS | Status: DC | PRN
  Administered 2016-09-03: 100 mg via INTRAVENOUS

## 2016-09-03 MED ORDER — FENTANYL CITRATE (PF) 100 MCG/2ML IJ SOLN: 25.0000 ug | INTRAMUSCULAR | Status: DC | PRN

## 2016-09-03 MED ORDER — EPHEDRINE 5 MG/ML INJ
INTRAVENOUS | Status: AC
  Filled 2016-09-03: qty 10

## 2016-09-03 MED ORDER — DEXAMETHASONE SODIUM PHOSPHATE 4 MG/ML IJ SOLN
INTRAMUSCULAR | Status: DC | PRN
  Administered 2016-09-03: 10 mg via INTRAVENOUS

## 2016-09-03 MED ORDER — FENTANYL CITRATE (PF) 100 MCG/2ML IJ SOLN
INTRAMUSCULAR | Status: AC
  Filled 2016-09-03: qty 2

## 2016-09-03 MED ORDER — GABAPENTIN 300 MG PO CAPS
300.0000 mg | ORAL_CAPSULE | ORAL | Status: AC
Start: 2016-09-03 — End: 2016-09-03
  Administered 2016-09-03: 300 mg via ORAL

## 2016-09-03 MED ORDER — KETOROLAC TROMETHAMINE 30 MG/ML IJ SOLN
INTRAMUSCULAR | Status: AC
  Filled 2016-09-03: qty 1

## 2016-09-03 MED ORDER — ACETAMINOPHEN 500 MG PO TABS
1000.0000 mg | ORAL_TABLET | ORAL | Status: AC
  Administered 2016-09-03: 1000 mg via ORAL

## 2016-09-03 MED ORDER — ONDANSETRON HCL 4 MG/2ML IJ SOLN
INTRAMUSCULAR | Status: AC
  Filled 2016-09-03: qty 2

## 2016-09-03 MED ORDER — TECHNETIUM TC 99M SULFUR COLLOID FILTERED
1.0000 | Freq: Once | INTRAVENOUS | Status: AC | PRN
  Administered 2016-09-03: 1 via INTRADERMAL

## 2016-09-03 MED ORDER — CIPROFLOXACIN IN D5W 400 MG/200ML IV SOLN
400.0000 mg | INTRAVENOUS | Status: AC
  Administered 2016-09-03: 400 mg via INTRAVENOUS

## 2016-09-03 SURGICAL SUPPLY — 65 items
ADH SKN CLS APL DERMABOND .7 (GAUZE/BANDAGES/DRESSINGS) ×1
APPLIER CLIP 9.375 MED OPEN (MISCELLANEOUS) ×3
APR CLP MED 9.3 20 MLT OPN (MISCELLANEOUS) ×1
BINDER BREAST LRG (GAUZE/BANDAGES/DRESSINGS) ×2 IMPLANT
BINDER BREAST MEDIUM (GAUZE/BANDAGES/DRESSINGS) IMPLANT
BINDER BREAST XLRG (GAUZE/BANDAGES/DRESSINGS) IMPLANT
BINDER BREAST XXLRG (GAUZE/BANDAGES/DRESSINGS) IMPLANT
BLADE SURG 15 STRL LF DISP TIS (BLADE) ×1 IMPLANT
BLADE SURG 15 STRL SS (BLADE) ×6
CANISTER SUC SOCK COL 7IN (MISCELLANEOUS) IMPLANT
CANISTER SUCT 1200ML W/VALVE (MISCELLANEOUS) ×2 IMPLANT
CHLORAPREP W/TINT 26ML (MISCELLANEOUS) ×3 IMPLANT
CLIP APPLIE 9.375 MED OPEN (MISCELLANEOUS) IMPLANT
CLIP TI WIDE RED SMALL 6 (CLIP) ×3 IMPLANT
CLOSURE WOUND 1/2 X4 (GAUZE/BANDAGES/DRESSINGS) ×1
COVER BACK TABLE 60X90IN (DRAPES) ×3 IMPLANT
COVER MAYO STAND STRL (DRAPES) ×3 IMPLANT
COVER PROBE W GEL 5X96 (DRAPES) ×3 IMPLANT
DECANTER SPIKE VIAL GLASS SM (MISCELLANEOUS) IMPLANT
DERMABOND ADVANCED (GAUZE/BANDAGES/DRESSINGS) ×2
DERMABOND ADVANCED .7 DNX12 (GAUZE/BANDAGES/DRESSINGS) ×1 IMPLANT
DEVICE DUBIN W/COMP PLATE 8390 (MISCELLANEOUS) ×3 IMPLANT
DRAPE LAPAROSCOPIC ABDOMINAL (DRAPES) ×3 IMPLANT
DRAPE UTILITY XL STRL (DRAPES) ×3 IMPLANT
ELECT COATED BLADE 2.86 ST (ELECTRODE) ×3 IMPLANT
ELECT REM PT RETURN 9FT ADLT (ELECTROSURGICAL) ×3
ELECTRODE REM PT RTRN 9FT ADLT (ELECTROSURGICAL) ×1 IMPLANT
GLOVE BIO SURGEON STRL SZ 6.5 (GLOVE) ×1 IMPLANT
GLOVE BIO SURGEON STRL SZ7 (GLOVE) ×6 IMPLANT
GLOVE BIO SURGEONS STRL SZ 6.5 (GLOVE) ×1
GLOVE BIOGEL PI IND STRL 6.5 (GLOVE) IMPLANT
GLOVE BIOGEL PI IND STRL 7.5 (GLOVE) ×1 IMPLANT
GLOVE BIOGEL PI INDICATOR 6.5 (GLOVE) ×2
GLOVE BIOGEL PI INDICATOR 7.5 (GLOVE) ×2
GOWN STRL REUS W/ TWL LRG LVL3 (GOWN DISPOSABLE) ×2 IMPLANT
GOWN STRL REUS W/TWL LRG LVL3 (GOWN DISPOSABLE) ×6
HEMOSTAT ARISTA ABSORB 3G PWDR (MISCELLANEOUS) IMPLANT
ILLUMINATOR WAVEGUIDE N/F (MISCELLANEOUS) ×2 IMPLANT
KIT MARKER MARGIN INK (KITS) ×3 IMPLANT
LIGHT WAVEGUIDE WIDE FLAT (MISCELLANEOUS) IMPLANT
NDL HYPO 25X1 1.5 SAFETY (NEEDLE) ×1 IMPLANT
NDL SAFETY ECLIPSE 18X1.5 (NEEDLE) IMPLANT
NEEDLE HYPO 18GX1.5 SHARP (NEEDLE)
NEEDLE HYPO 25X1 1.5 SAFETY (NEEDLE) ×3 IMPLANT
NS IRRIG 1000ML POUR BTL (IV SOLUTION) IMPLANT
PACK BASIN DAY SURGERY FS (CUSTOM PROCEDURE TRAY) ×3 IMPLANT
PENCIL BUTTON HOLSTER BLD 10FT (ELECTRODE) ×3 IMPLANT
SLEEVE SCD COMPRESS KNEE MED (MISCELLANEOUS) ×3 IMPLANT
SPONGE LAP 4X18 X RAY DECT (DISPOSABLE) ×3 IMPLANT
STRIP CLOSURE SKIN 1/2X4 (GAUZE/BANDAGES/DRESSINGS) ×2 IMPLANT
SUT ETHILON 2 0 FS 18 (SUTURE) IMPLANT
SUT MNCRL AB 4-0 PS2 18 (SUTURE) ×3 IMPLANT
SUT MON AB 5-0 PS2 18 (SUTURE) IMPLANT
SUT SILK 2 0 SH (SUTURE) IMPLANT
SUT VIC AB 2-0 SH 27 (SUTURE) ×6
SUT VIC AB 2-0 SH 27XBRD (SUTURE) ×1 IMPLANT
SUT VIC AB 3-0 SH 27 (SUTURE) ×3
SUT VIC AB 3-0 SH 27X BRD (SUTURE) ×1 IMPLANT
SUT VIC AB 5-0 PS2 18 (SUTURE) IMPLANT
SYR CONTROL 10ML LL (SYRINGE) ×3 IMPLANT
TOWEL OR 17X24 6PK STRL BLUE (TOWEL DISPOSABLE) ×3 IMPLANT
TOWEL OR NON WOVEN STRL DISP B (DISPOSABLE) ×3 IMPLANT
TUBE CONNECTING 20'X1/4 (TUBING) ×1
TUBE CONNECTING 20X1/4 (TUBING) ×1 IMPLANT
YANKAUER SUCT BULB TIP NO VENT (SUCTIONS) ×2 IMPLANT

## 2016-09-03 NOTE — Interval H&P Note (Signed)
History and Physical Interval Note:  09/03/2016 10:29 AM  Victoria [name]  has presented today for surgery, with the diagnosis of right breast cancer  The various methods of treatment have been discussed with the patient and family. After consideration of risks, benefits and other options for treatment, the patient has consented to  Procedure(s): RIGHT BREAST LUMPECTOMY WITH RADIOACTIVE SEED AND RIGHT AXILLARY SENTINEL NODE BIOPSY (Right) as a surgical intervention .  The patient's history has been reviewed, patient examined, no change in status, stable for surgery.  I have reviewed the patient's chart and labs.  Questions were answered to the patient's satisfaction.     Melvenia Favela

## 2016-09-03 NOTE — Discharge Instructions (Signed)
[place] Office Phone Number [phone]   POST OP INSTRUCTIONS  Always review your discharge instruction sheet given to you by the facility where your surgery was performed.  IF YOU HAVE DISABILITY OR FAMILY LEAVE FORMS, YOU MUST BRING THEM TO THE OFFICE FOR PROCESSING.  DO NOT GIVE THEM TO YOUR DOCTOR.  1. A prescription for pain medication may be given to you upon discharge.  Take your pain medication as prescribed, if needed.  If narcotic pain medicine is not needed, then you may take acetaminophen (Tylenol), naprosyn (Alleve) or ibuprofen (Advil) as needed. 2. Take your usually prescribed medications unless otherwise directed 3. If you need a refill on your pain medication, please contact your pharmacy.  They will contact our office to request authorization.  Prescriptions will not be filled after 5pm or on week-ends. 4. You should eat very light the first 24 hours after surgery, such as soup, crackers, pudding, etc.  Resume your normal diet the day after surgery. 5. Most patients will experience some swelling and bruising in the breast.  Ice packs and a good support bra will help.  Wear the breast binder provided or a sports bra for 72 hours day and night.  After that wear a sports bra during the day until you return to the office. Swelling and bruising can take several days to resolve.  6. It is common to experience some constipation if taking pain medication after surgery.  Increasing fluid intake and taking a stool softener will usually help or prevent this problem from occurring.  A mild laxative (Milk of Magnesia or Miralax) should be taken according to package directions if there are no bowel movements after 48 hours. 7. Unless discharge instructions indicate otherwise, you may remove your bandages 48 hours after surgery and you may shower at that time.  You may have steri-strips (small skin tapes) in place directly over the incision.  These strips should be left on the  skin for 7-10 days and will come off on their own.  If your surgeon used skin glue on the incision, you may shower in 24 hours.  The glue will flake off over the next 2-3 weeks.  Any sutures or staples will be removed at the office during your follow-up visit. 8. ACTIVITIES:  You may resume regular daily activities (gradually increasing) beginning the next day.  Wearing a good support bra or sports bra minimizes pain and swelling.  You may have sexual intercourse when it is comfortable. a. You may drive when you no longer are taking prescription pain medication, you can comfortably wear a seatbelt, and you can safely maneuver your car and apply brakes. b. RETURN TO WORK:  ______________________________________________________________________________________ 9. You should see your doctor in the office for a follow-up appointment approximately two weeks after your surgery.  Your doctors nurse will typically make your follow-up appointment when she calls you with your pathology report.  Expect your pathology report 3-4 business days after your surgery.  You may call to check if you do not hear from [place] after three days. 10. OTHER INSTRUCTIONS: _______________________________________________________________________________________________ _____________________________________________________________________________________________________________________________________ _____________________________________________________________________________________________________________________________________ _____________________________________________________________________________________________________________________________________  WHEN TO CALL DR WAKEFIELD: 1. Fever over 101.0 2. Nausea and/or vomiting. 3. Extreme swelling or bruising. 4. Continued bleeding from incision. 5. Increased pain, redness, or drainage from the incision.  The clinic staff is available to answer your questions during regular  business hours.  Please dont hesitate to call and ask to speak to one of the nurses for clinical concerns.  If you have a medical emergency, go to the nearest emergency room or call 911.  A surgeon from [facility] is always on call at the hospital.  For further questions, please visit [email or web address]    Post Anesthesia Home Care Instructions  Activity: Get plenty of rest for the remainder of the day. A responsible individual must stay with you for 24 hours following the procedure.  For the next 24 hours, DO NOT: -Drive a car -Paediatric nurse -Drink alcoholic beverages -Take any medication unless instructed by your physician -Make any legal decisions or sign important papers.  Meals: Start with liquid foods such as gelatin or soup. Progress to regular foods as tolerated. Avoid greasy, spicy, heavy foods. If nausea and/or vomiting occur, drink only clear liquids until the nausea and/or vomiting subsides. Call your physician if vomiting continues.  Special Instructions/Symptoms: Your throat may feel dry or sore from the anesthesia or the breathing tube placed in your throat during surgery. If this causes discomfort, gargle with warm salt water. The discomfort should disappear within 24 hours.  If you had a scopolamine patch placed behind your ear for the management of post- operative nausea and/or vomiting:  1. The medication in the patch is effective for 72 hours, after which it should be removed.  Wrap patch in a tissue and discard in the trash. Wash hands thoroughly with soap and water. 2. You may remove the patch earlier than 72 hours if you experience unpleasant side effects which may include dry mouth, dizziness or visual disturbances. 3. Avoid touching the patch. Wash your hands with soap and water after contact with the patch.   Call your surgeon if you experience:   1.  Fever over 101.0. 2.  Inability to urinate. 3.  Nausea and/or vomiting. 4.   Extreme swelling or bruising at the surgical site. 5.  Continued bleeding from the incision. 6.  Increased pain, redness or drainage from the incision. 7.  Problems related to your pain medication. 8.  Any problems and/or concerns

## 2016-09-03 NOTE — Op Note (Signed)
Preoperative diagnosis: Rightbreast cancer, clinical stage I Postoperative diagnosis: same as above Procedure: 1. Right breast seed guided lumpectomy 2. Rightdeep axillary sentinel node biopsy Surgeon Dr Serita Grammes Anes general  EBL: minimal Comps none Specimen:  1. Rightbreast marked with paint containing clip, seed separate 2. Right axillary sentinel nodes with highest count 569 Sponge count correct at completion Dispoto recovery stable  Indications: This is a 81yof with stage I right breast cancer. We have decided to proceed with lumpectomy/sn biopsy.She had seed placed prior to beginning. I had these mm in the OR.   Procedure: After informed consent was obtained the patient was taken to the OR. She was injected with technetium in the standard periareolar fashion.She was given anitibiotics. SCDs were in place. She was prepped and draped in the standard sterile surgical fashion. A timeout was performed. She had undergone a pectoral block.  I then located the seed in the lower inner quadrant.  I infiltrated marcaine and made a medial inframammary  incision to hide the scar.  I then dissected to the seed.I then removed the seedwith an attempt to get a clear margin. This waspassed off the table after marking with paint.The seed did come out of hematoma.  I did confirm removal of theclipand seed with mammography.The posterior margin is the pectoralis.  I obtained hemostasis. I placed clips in this cavity. I then closed with 2-0 vicryl, 3-0 vicryl and 5-0 monocryl. Glue and steristrips were applied. I then made an incision below the axillary hairline. I then opened the axillary fascia. I identified the sentinel nodes with the highest count as above. There was no gross nodal disease.There was no background radioactivity. I obtained hemostasis. I closed the axillary fascia and breast tissue with 2-0 vicryl. I then closed the dermis with 3-0 vicryl and the skin with 4-0  monocryl.  Glue and steristrips were placed A breast binder was placed. She was extubated and transferred to recovery stable

## 2016-09-03 NOTE — Transfer of Care (Signed)
Immediate Anesthesia Transfer of Care Note  Patient: Victoria [name]  Procedure(s) Performed: Procedure(s): RIGHT BREAST LUMPECTOMY WITH RADIOACTIVE SEED AND RIGHT AXILLARY SENTINEL NODE BIOPSY (Right)  Patient Location: PACU  Anesthesia Type:General  Level of Consciousness: awake and sedated  Airway & Oxygen Therapy: Patient Spontanous Breathing and Patient connected to face mask oxygen  Post-op Assessment: Report given to RN and Post -op Vital signs reviewed and stable  Post vital signs: Reviewed and stable  Last Vitals:  Vitals:   09/03/16 0943  BP: 109/74  Pulse: 78  Resp: 16  Temp: 37.2 C    Last Pain:  Vitals:   09/03/16 0943  TempSrc: Oral  PainSc: 5       Patients Stated Pain Goal: 1 (34/19/37 9024)  Complications: No apparent anesthesia complications

## 2016-09-03 NOTE — Anesthesia Preprocedure Evaluation (Signed)
Anesthesia Evaluation  Patient identified by MRN, date of birth, ID band Patient awake    Reviewed: Allergy & Precautions, NPO status , Patient's Chart, lab work & pertinent test results  Airway Mallampati: II  TM Distance: >3 FB Neck ROM: Full    Dental  (+) Teeth Intact, Dental Advisory Given   Pulmonary neg pulmonary ROS,    Pulmonary exam normal breath sounds clear to auscultation       Cardiovascular Exercise Tolerance: Good negative cardio ROS Normal cardiovascular exam Rhythm:Regular Rate:Normal     Neuro/Psych PSYCHIATRIC DISORDERS Depression negative neurological ROS     GI/Hepatic negative GI ROS, Neg liver ROS,   Endo/Other  negative endocrine ROS  Renal/GU negative Renal ROS     Musculoskeletal  (+) Arthritis , Osteoarthritis,    Abdominal   Peds  Hematology negative hematology ROS (+)   Anesthesia Other Findings Day of surgery medications reviewed with the patient.  Right breast cancer  Reproductive/Obstetrics                            Anesthesia Physical Anesthesia Plan  ASA: II  Anesthesia Plan: General   Post-op Pain Management:  Regional for Post-op pain   Induction: Intravenous  Airway Management Planned: LMA  Additional Equipment:   Intra-op Plan:   Post-operative Plan: Extubation in OR  Informed Consent: I have reviewed the patients History and Physical, chart, labs and discussed the procedure including the risks, benefits and alternatives for the proposed anesthesia with the patient or authorized representative who has indicated his/her understanding and acceptance.   Dental advisory given  Plan Discussed with: CRNA  Anesthesia Plan Comments: (GA with LMA plus PECS block.)        Anesthesia Quick Evaluation

## 2016-09-03 NOTE — Progress Notes (Signed)
Assisted Dr. Gifford Shave with right, ultrasound guided, pectoralis block.and nuc med tech # [phone] with nuc med inj Side rails up, monitors on throughout procedure. See vital signs in flow sheet. Tolerated Procedure well.

## 2016-09-03 NOTE — Anesthesia Procedure Notes (Signed)
Procedure Name: LMA Insertion Date/Time: 09/03/2016 10:49 AM Performed by: Maryella Shivers Pre-anesthesia Checklist: Patient identified, Emergency Drugs available, Suction available and Patient being monitored Patient Re-evaluated:Patient Re-evaluated prior to inductionOxygen Delivery Method: Circle system utilized Preoxygenation: Pre-oxygenation with 100% oxygen Intubation Type: IV induction Ventilation: Mask ventilation without difficulty LMA: LMA inserted LMA Size: 4.0 Number of attempts: 1 Airway Equipment and Method: Bite block Placement Confirmation: positive ETCO2 Tube secured with: Tape Dental Injury: Teeth and Oropharynx as per pre-operative assessment

## 2016-09-03 NOTE — H&P (Signed)
Victoria [name] is an 66 y.o. female.   Chief Complaint: breast cancer HPI:   30 yof referred by Dr Victoria [name] for screening detected right breast mass. she has fh in aunt at age 17. she has personal history of cyst aspirations. she was noted to have a 4 mm liq mass on the right side that is 5 mm on [place]. axilla is negative on [place]. biopsy was done showing a grade I IDC that is 100/100 % positive, her 2 negative and Ki is 2%. she has no mass or dc noted. she is here with her daughter to discuss options   Past Medical History:  Diagnosis Date  . Arthritis    neck  . Cancer ([place]) 07/2016   right breast cancer  . Depression   . No pertinent past medical history     Past Surgical History:  Procedure Laterality Date  . BACK SURGERY     diskectomy L5-6  . CESAREAN SECTION     triplets  . DILATION AND CURETTAGE OF UTERUS    . KNEE ARTHROSCOPY Right     Family History  Problem Relation Age of Onset  . Breast cancer Paternal Aunt   . Breast cancer Cousin    Social History:  reports that she has never smoked. She has never used smokeless tobacco. She reports that she drinks alcohol. She reports that she does not use drugs.  Allergies:  Allergies  Allergen Reactions  . Darvon [Propoxyphene Hcl]     hyperactivity  . Penicillins Itching    No prescriptions prior to admission.    No results found for this or any previous visit (from the past 48 hour(s)). No results found.  Review of Systems  Constitutional: Negative for fever.  All other systems reviewed and are negative.   Height 5\' 4"  (1.626 m), weight 67.6 kg (149 lb). Physical Exam  General Mental Status-Alert. Orientation-Oriented X3. Head and Neck Thyroid -Note: no thyromegaly, no thyroid mass. Eye Sclera/Conjunctiva - Bilateral-No scleral icterus. Chest and Lung Exam Chest and lung exam reveals -quiet, even and easy respiratory effort with no use of accessory muscles and on auscultation, normal  breath sounds, no adventitious sounds and normal vocal resonance. Breast Nipples-No Discharge. Breast Lump-No Palpable Breast Mass. Cardiovascular Cardiovascular examination reveals -normal heart sounds, regular rate and rhythm with no murmurs. Abdomen Note: soft nt/nd Lymphatic Head & Neck General Head & Neck Lymphatics: Bilateral - Description - Normal. Axillary General Axillary Region: Bilateral - Description - Normal. Note: no [place] adenopathy  Assessment/Plan BREAST CANCER OF LOWER-INNER QUADRANT OF RIGHT FEMALE BREAST (C50.311) Story: Right breast seed guided lumpectomy, right axillary sentinel node biopsy We discussed the staging and pathophysiology of breast cancer. We discussed all of the different options for treatment for breast cancer including surgery, chemotherapy, radiation therapy, Herceptin, and antiestrogen therapy. We discussed a sentinel lymph node biopsy as she does not appear to having lymph node involvement right now. We discussed that she would have an incision underneath her axillary hairline or would be done through the same incision. We discussed up to a 5% risk lifetime of chronic shoulder pain as well as lymphedema associated with a sentinel lymph node biopsy. We discussed the options for treatment of the breast cancer which included lumpectomy versus a mastectomy. We discussed the performance of the lumpectomy with radioactive seed placement. We discussed a 5-10% chance of a positive margin requiring reexcision in the operating room. We also discussed that she will likely need radiation therapy (this is  usually 5-7 weeks) if she undergoes lumpectomy. The breast cannot undergo more radiation therapy in the same breast after lumpectomy in the future. We discussed the mastectomy (removal of whole breast) and the postoperative care for that as well. Mastectomy can be followed by reconstruction. This is a more extensive surgery and requires more recovery. The  decision for lumpectomy vs mastectomy has no impact on decision for chemotherapy. Most mastectomy patients will not need radiation therapy. We discussed that there is no difference in her survival whether she undergoes lumpectomy with radiation therapy or antiestrogen therapy versus a mastectomy. There is also no real difference between her recurrence in the breast. We discussed the risks of operation including bleeding, infection, possible reoperation. She understands her further therapy will be based on what her stages at the time of her operation.  Victoria Bookbinder, MD 09/03/2016, 7:17 AM

## 2016-09-03 NOTE — Progress Notes (Signed)
Patient ID: Victoria [name], female   DOB: November 10, 1950, 66 y.o.   MRN: [id number] I reviewed NCCSR on day of surgery, postop narcotic given #10

## 2016-09-03 NOTE — Anesthesia Procedure Notes (Signed)
Anesthesia Regional Block: Pectoralis block   Pre-Anesthetic Checklist: ,, timeout performed, Correct Patient, Correct Site, Correct Laterality, Correct Procedure, Correct Position, site marked, Risks and benefits discussed,  Surgical consent,  Pre-op evaluation,  At surgeon's request and post-op pain management  Laterality: Right  Prep: chloraprep       Needles:  Injection technique: Single-shot  Needle Type: Echogenic Needle     Needle Length: 9cm  Needle Gauge: 21     Additional Needles:   Procedures: ultrasound guided,,,,,,,,  Narrative:  Start time: 09/03/2016 10:15 AM End time: 09/03/2016 10:21 AM Injection made incrementally with aspirations every 5 mL.  Performed by: Personally  Anesthesiologist: Catalina Gravel  Additional Notes: No pain on injection. No increased resistance to injection. Injection made in 5cc increments.  Good needle visualization.  Patient tolerated procedure well.

## 2016-09-03 NOTE — Anesthesia Postprocedure Evaluation (Signed)
Anesthesia Post Note  Patient: Victoria [name]  Procedure(s) Performed: Procedure(s) (LRB): RIGHT BREAST LUMPECTOMY WITH RADIOACTIVE SEED AND RIGHT AXILLARY SENTINEL NODE BIOPSY (Right)  Patient location during evaluation: PACU Anesthesia Type: General Level of consciousness: awake and alert Pain management: pain level controlled Vital Signs Assessment: post-procedure vital signs reviewed and stable Respiratory status: spontaneous breathing, nonlabored ventilation, respiratory function stable and patient connected to nasal cannula oxygen Cardiovascular status: blood pressure returned to baseline and stable Postop Assessment: no signs of nausea or vomiting Anesthetic complications: no       Last Vitals:  Vitals:   09/03/16 1315 09/03/16 1343  BP: 110/69 125/75  Pulse: 75 79  Resp: 17 20  Temp:  36.5 C    Last Pain:  Vitals:   09/03/16 1343  TempSrc: Oral  PainSc: 2                  Catalina Gravel

## 2016-09-05 ENCOUNTER — Other Ambulatory Visit: Payer: Self-pay | Admitting: General Surgery

## 2016-09-05 ENCOUNTER — Encounter (HOSPITAL_BASED_OUTPATIENT_CLINIC_OR_DEPARTMENT_OTHER): Payer: Self-pay | Admitting: General Surgery

## 2016-09-10 ENCOUNTER — Encounter (HOSPITAL_COMMUNITY): Payer: Self-pay | Admitting: *Deleted

## 2016-09-10 NOTE — Progress Notes (Signed)
Pt denies SOB, chest pain, and being under the care of a cardiologist. Pt stated that a stress test was performed > 10 years ago.Pt denies having an echo and cardiac acth. Pt denies having a chest x ray and EKG within the last year. Pt made aware to stop  taking Aspirin, vitamins, fish oil, Glucosamine and herbal medications. Do not take any NSAIDs ie: Ibuprofen, Advil, Naproxen, BC and Goody powder or any medication containing Aspirin. Pt verbalized understanding of all pre-op instructions.

## 2016-09-11 ENCOUNTER — Ambulatory Visit (HOSPITAL_COMMUNITY): Payer: BC Managed Care – PPO | Admitting: Anesthesiology

## 2016-09-11 ENCOUNTER — Encounter (HOSPITAL_COMMUNITY): Payer: Self-pay | Admitting: Surgery

## 2016-09-11 ENCOUNTER — Ambulatory Visit (HOSPITAL_COMMUNITY)
Admission: RE | Admit: 2016-09-11 | Discharge: 2016-09-11 | Disposition: A | Discharge status: Home / Self Care | Payer: BC Managed Care – PPO | Source: Ambulatory Visit | Attending: General Surgery | Admitting: General Surgery

## 2016-09-11 ENCOUNTER — Encounter (HOSPITAL_COMMUNITY)
Admission: RE | Disposition: A | Discharge status: Home / Self Care | Payer: Self-pay | Source: Ambulatory Visit | Attending: General Surgery

## 2016-09-11 DIAGNOSIS — C50512 Malignant neoplasm of lower-outer quadrant of left female breast: Secondary | ICD-10-CM

## 2016-09-11 DIAGNOSIS — C50311 Malignant neoplasm of lower-inner quadrant of right female breast: Secondary | ICD-10-CM | POA: Insufficient documentation

## 2016-09-11 HISTORY — DX: Personal history of urinary calculi: Z87.442

## 2016-09-11 HISTORY — DX: Headache: R51

## 2016-09-11 HISTORY — PX: RE-EXCISION OF BREAST LUMPECTOMY: SHX6048

## 2016-09-11 HISTORY — DX: Headache, unspecified: R51.9

## 2016-09-11 HISTORY — DX: Family history of other specified conditions: Z84.89

## 2016-09-11 LAB — BASIC METABOLIC PANEL
Anion gap: 11 (ref 5–15)
BUN: 11 mg/dL (ref 6–20)
CHLORIDE: 107 mmol/L (ref 101–111)
CO2: 21 mmol/L — AB (ref 22–32)
CREATININE: 0.65 mg/dL (ref 0.44–1.00)
Calcium: 8.7 mg/dL — ABNORMAL LOW (ref 8.9–10.3)
GFR calc Af Amer: >60 mL/min (ref >60)
GFR calc non Af Amer: >60 mL/min (ref >60)
Glucose, Bld: 100 mg/dL — ABNORMAL HIGH (ref 65–99)
POTASSIUM: 4 mmol/L (ref 3.5–5.1)
SODIUM: 139 mmol/L (ref 135–145)

## 2016-09-11 LAB — CBC
HEMATOCRIT: 38.2 % (ref 36.0–46.0)
Hemoglobin: 12.2 g/dL (ref 12.0–15.0)
MCH: 29.1 pg (ref 26.0–34.0)
MCHC: 31.9 g/dL (ref 30.0–36.0)
MCV: 91.2 fL (ref 78.0–100.0)
Platelets: 178 10*3/uL (ref 150–400)
RBC: 4.19 MIL/uL (ref 3.87–5.11)
RDW: 13.3 % (ref 11.5–15.5)
WBC: 7 10*3/uL (ref 4.0–10.5)

## 2016-09-11 SURGERY — EXCISION, LESION, BREAST
Anesthesia: General | Site: Breast | Laterality: Right

## 2016-09-11 MED ORDER — PROMETHAZINE HCL 25 MG/ML IJ SOLN: 6.2500 mg | INTRAMUSCULAR | Status: DC | PRN

## 2016-09-11 MED ORDER — SODIUM CHLORIDE 0.9 % IV SOLN: 250.0000 mL | INTRAVENOUS | Status: DC | PRN

## 2016-09-11 MED ORDER — OXYCODONE HCL 5 MG PO TABS: 5.0000 mg | ORAL_TABLET | Freq: Once | ORAL | Status: DC | PRN

## 2016-09-11 MED ORDER — LACTATED RINGERS IV SOLN
INTRAVENOUS | Status: DC | PRN
  Administered 2016-09-11: 09:00:00 via INTRAVENOUS

## 2016-09-11 MED ORDER — MEPERIDINE HCL 25 MG/ML IJ SOLN: 6.2500 mg | INTRAMUSCULAR | Status: DC | PRN

## 2016-09-11 MED ORDER — HYDROMORPHONE HCL 1 MG/ML IJ SOLN: 0.2500 mg | INTRAMUSCULAR | Status: DC | PRN

## 2016-09-11 MED ORDER — ACETAMINOPHEN 650 MG RE SUPP: 650.0000 mg | RECTAL | Status: DC | PRN

## 2016-09-11 MED ORDER — BUPIVACAINE HCL (PF) 0.25 % IJ SOLN
INTRAMUSCULAR | Status: AC
  Filled 2016-09-11: qty 30

## 2016-09-11 MED ORDER — SODIUM CHLORIDE 0.9% FLUSH: 3.0000 mL | Freq: Two times a day (BID) | INTRAVENOUS | Status: DC

## 2016-09-11 MED ORDER — PROPOFOL 10 MG/ML IV BOLUS
INTRAVENOUS | Status: AC
  Filled 2016-09-11: qty 40

## 2016-09-11 MED ORDER — ACETAMINOPHEN 325 MG PO TABS: 650.0000 mg | ORAL_TABLET | ORAL | Status: DC | PRN

## 2016-09-11 MED ORDER — OXYCODONE HCL 5 MG PO TABS: 5.0000 mg | ORAL_TABLET | ORAL | Status: DC | PRN

## 2016-09-11 MED ORDER — BUPIVACAINE HCL 0.25 % IJ SOLN
INTRAMUSCULAR | Status: DC | PRN
  Administered 2016-09-11: 10 mL

## 2016-09-11 MED ORDER — LIDOCAINE-EPINEPHRINE 1 %-1:100000 IJ SOLN
INTRAMUSCULAR | Status: AC
  Filled 2016-09-11: qty 1

## 2016-09-11 MED ORDER — OXYCODONE HCL 5 MG/5ML PO SOLN: 5.0000 mg | Freq: Once | ORAL | Status: DC | PRN

## 2016-09-11 MED ORDER — FENTANYL CITRATE (PF) 250 MCG/5ML IJ SOLN
INTRAMUSCULAR | Status: AC
  Filled 2016-09-11: qty 5

## 2016-09-11 MED ORDER — ACETAMINOPHEN 500 MG PO TABS
1000.0000 mg | ORAL_TABLET | ORAL | Status: AC
  Administered 2016-09-11: 1000 mg via ORAL
  Filled 2016-09-11: qty 2

## 2016-09-11 MED ORDER — LACTATED RINGERS IV SOLN
INTRAVENOUS | Status: DC
  Administered 2016-09-11: 07:00:00 via INTRAVENOUS

## 2016-09-11 MED ORDER — MIDAZOLAM HCL 2 MG/2ML IJ SOLN
INTRAMUSCULAR | Status: AC
  Filled 2016-09-11: qty 2

## 2016-09-11 MED ORDER — KETOROLAC TROMETHAMINE 15 MG/ML IJ SOLN: 15.0000 mg | Freq: Once | INTRAMUSCULAR | Status: DC

## 2016-09-11 MED ORDER — FENTANYL CITRATE (PF) 100 MCG/2ML IJ SOLN
INTRAMUSCULAR | Status: DC | PRN
  Administered 2016-09-11: 50 ug via INTRAVENOUS

## 2016-09-11 MED ORDER — CIPROFLOXACIN IN D5W 400 MG/200ML IV SOLN
400.0000 mg | INTRAVENOUS | Status: AC
  Administered 2016-09-11: 400 mg via INTRAVENOUS
  Filled 2016-09-11: qty 200

## 2016-09-11 MED ORDER — GABAPENTIN 300 MG PO CAPS
300.0000 mg | ORAL_CAPSULE | ORAL | Status: AC
  Administered 2016-09-11: 300 mg via ORAL
  Filled 2016-09-11: qty 1

## 2016-09-11 MED ORDER — SODIUM CHLORIDE 0.9% FLUSH: 3.0000 mL | INTRAVENOUS | Status: DC | PRN

## 2016-09-11 MED ORDER — 0.9 % SODIUM CHLORIDE (POUR BTL) OPTIME
TOPICAL | Status: DC | PRN
  Administered 2016-09-11: 1000 mL

## 2016-09-11 MED ORDER — ONDANSETRON HCL 4 MG/2ML IJ SOLN
INTRAMUSCULAR | Status: DC | PRN
  Administered 2016-09-11: 4 mg via INTRAVENOUS

## 2016-09-11 MED ORDER — EPHEDRINE SULFATE 50 MG/ML IJ SOLN
INTRAMUSCULAR | Status: DC | PRN
  Administered 2016-09-11: 10 mg via INTRAVENOUS

## 2016-09-11 SURGICAL SUPPLY — 52 items
ADH SKN CLS APL DERMABOND .7 (GAUZE/BANDAGES/DRESSINGS) ×1
APPLIER CLIP 9.375 MED OPEN (MISCELLANEOUS)
APR CLP MED 9.3 20 MLT OPN (MISCELLANEOUS)
BINDER BREAST LRG (GAUZE/BANDAGES/DRESSINGS) IMPLANT
BINDER BREAST MEDIUM (GAUZE/BANDAGES/DRESSINGS) ×2 IMPLANT
BINDER BREAST XLRG (GAUZE/BANDAGES/DRESSINGS) IMPLANT
CANISTER SUCT 3000ML PPV (MISCELLANEOUS) ×3 IMPLANT
CHLORAPREP W/TINT 26ML (MISCELLANEOUS) ×3 IMPLANT
CLIP APPLIE 9.375 MED OPEN (MISCELLANEOUS) IMPLANT
CLOSURE WOUND 1/2 X4 (GAUZE/BANDAGES/DRESSINGS) ×1
COVER SURGICAL LIGHT HANDLE (MISCELLANEOUS) ×3 IMPLANT
DECANTER SPIKE VIAL GLASS SM (MISCELLANEOUS) ×3 IMPLANT
DERMABOND ADVANCED (GAUZE/BANDAGES/DRESSINGS) ×2
DERMABOND ADVANCED .7 DNX12 (GAUZE/BANDAGES/DRESSINGS) ×1 IMPLANT
DRAPE CHEST BREAST 15X10 FENES (DRAPES) ×3 IMPLANT
ELECT CAUTERY BLADE 6.4 (BLADE) ×3 IMPLANT
ELECT REM PT RETURN 9FT ADLT (ELECTROSURGICAL) ×3
ELECTRODE REM PT RTRN 9FT ADLT (ELECTROSURGICAL) ×1 IMPLANT
GLOVE BIO SURGEON STRL SZ7 (GLOVE) ×3 IMPLANT
GLOVE BIOGEL PI IND STRL 7.5 (GLOVE) ×1 IMPLANT
GLOVE BIOGEL PI INDICATOR 7.5 (GLOVE) ×2
GLOVE SURG SS PI 7.0 STRL IVOR (GLOVE) ×4 IMPLANT
GOWN STRL REUS W/ TWL LRG LVL3 (GOWN DISPOSABLE) ×2 IMPLANT
GOWN STRL REUS W/ TWL XL LVL3 (GOWN DISPOSABLE) IMPLANT
GOWN STRL REUS W/TWL LRG LVL3 (GOWN DISPOSABLE) ×3
GOWN STRL REUS W/TWL XL LVL3 (GOWN DISPOSABLE) ×3
ILLUMINATOR WAVEGUIDE N/F (MISCELLANEOUS) ×2 IMPLANT
KIT BASIN OR (CUSTOM PROCEDURE TRAY) ×3 IMPLANT
KIT MARKER MARGIN INK (KITS) ×2 IMPLANT
KIT ROOM TURNOVER OR (KITS) ×3 IMPLANT
NDL HYPO 25GX1X1/2 BEV (NEEDLE) ×1 IMPLANT
NEEDLE HYPO 25GX1X1/2 BEV (NEEDLE) ×3 IMPLANT
NS IRRIG 1000ML POUR BTL (IV SOLUTION) ×3 IMPLANT
PACK SURGICAL SETUP 50X90 (CUSTOM PROCEDURE TRAY) ×3 IMPLANT
PAD ARMBOARD 7.5X6 YLW CONV (MISCELLANEOUS) ×3 IMPLANT
PENCIL BUTTON HOLSTER BLD 10FT (ELECTRODE) ×3 IMPLANT
SPONGE LAP 18X18 X RAY DECT (DISPOSABLE) ×3 IMPLANT
STAPLER VISISTAT 35W (STAPLE) ×3 IMPLANT
STRIP CLOSURE SKIN 1/2X4 (GAUZE/BANDAGES/DRESSINGS) ×1 IMPLANT
SUT MNCRL AB 4-0 PS2 18 (SUTURE) ×3 IMPLANT
SUT SILK 2 0 SH (SUTURE) IMPLANT
SUT VIC AB 2-0 SH 27 (SUTURE) ×3
SUT VIC AB 2-0 SH 27XBRD (SUTURE) ×1 IMPLANT
SUT VIC AB 3-0 SH 27 (SUTURE) ×3
SUT VIC AB 3-0 SH 27X BRD (SUTURE) ×1 IMPLANT
SYR BULB 3OZ (MISCELLANEOUS) ×3 IMPLANT
SYR CONTROL 10ML LL (SYRINGE) ×3 IMPLANT
TOWEL OR 17X24 6PK STRL BLUE (TOWEL DISPOSABLE) ×3 IMPLANT
TOWEL OR 17X26 10 PK STRL BLUE (TOWEL DISPOSABLE) ×3 IMPLANT
TUBE CONNECTING 12'X1/4 (SUCTIONS) ×1
TUBE CONNECTING 12X1/4 (SUCTIONS) ×2 IMPLANT
YANKAUER SUCT BULB TIP NO VENT (SUCTIONS) ×3 IMPLANT

## 2016-09-11 NOTE — Interval H&P Note (Signed)
History and Physical Interval Note:  09/11/2016 8:58 AM  Victoria [name]  has presented today for surgery, with the diagnosis of right breast cancer  The various methods of treatment have been discussed with the patient and family. After consideration of risks, benefits and other options for treatment, the patient has consented to  Procedure(s): RIGHT RE-EXCISION OF BREAST LUMPECTOMY (Right) as a surgical intervention .  The patient's history has been reviewed, patient examined, no change in status, stable for surgery.  I have reviewed the patient's chart and labs.  Questions were answered to the patient's satisfaction.     Victoria [name]

## 2016-09-11 NOTE — Anesthesia Preprocedure Evaluation (Addendum)
Anesthesia Evaluation  Patient identified by MRN, date of birth, ID band Patient awake    Reviewed: Allergy & Precautions, NPO status , Patient's Chart, lab work & pertinent test results  Airway Mallampati: II  TM Distance: >3 FB Neck ROM: Full    Dental  (+) Teeth Intact, Dental Advisory Given   Pulmonary neg pulmonary ROS,    Pulmonary exam normal breath sounds clear to auscultation       Cardiovascular Exercise Tolerance: Good negative cardio ROS Normal cardiovascular exam Rhythm:Regular Rate:Normal     Neuro/Psych PSYCHIATRIC DISORDERS Depression negative neurological ROS     GI/Hepatic negative GI ROS, Neg liver ROS,   Endo/Other  negative endocrine ROS  Renal/GU negative Renal ROS     Musculoskeletal  (+) Arthritis , Osteoarthritis,    Abdominal   Peds  Hematology negative hematology ROS (+)   Anesthesia Other Findings Day of surgery medications reviewed with the patient.  Right breast cancer  Reproductive/Obstetrics                             Anesthesia Physical  Anesthesia Plan  ASA: II  Anesthesia Plan: General   Post-op Pain Management:  Regional for Post-op pain   Induction: Intravenous  Airway Management Planned: LMA  Additional Equipment:   Intra-op Plan:   Post-operative Plan: Extubation in OR  Informed Consent: I have reviewed the patients History and Physical, chart, labs and discussed the procedure including the risks, benefits and alternatives for the proposed anesthesia with the patient or authorized representative who has indicated his/her understanding and acceptance.   Dental advisory given  Plan Discussed with: CRNA  Anesthesia Plan Comments: (GA with LMA )       Anesthesia Quick Evaluation

## 2016-09-11 NOTE — Anesthesia Procedure Notes (Signed)
Procedure Name: LMA Insertion Date/Time: 09/11/2016 9:31 AM Performed by: Eligha Bridegroom Pre-anesthesia Checklist: Patient identified, Emergency Drugs available, Suction available, Patient being monitored and Timeout performed Oxygen Delivery Method: Circle system utilized Ventilation: Mask ventilation without difficulty LMA: LMA inserted LMA Size: 4.0 Placement Confirmation: positive ETCO2 and breath sounds checked- equal and bilateral Tube secured with: Tape Dental Injury: Teeth and Oropharynx as per pre-operative assessment

## 2016-09-11 NOTE — Op Note (Signed)
Preoperative diagnosis: Rightbreast cancer, clinical stage I s/p lump/sn with positive anterior margin Postoperative diagnosis: same as above Procedure: right breast re-excision lumpectomy anterior margin Surgeon Dr Serita Grammes Anes general  EBL: minimal Comps none Specimen: right breast anterior margin marked with paint Sponge count correct at completion Dispoto recovery stable  Indications: This is a 45yof with stage I right breast cancer.she underwent lumpectomy/sn biopsy with finding of positive anterior margin. The nodes are negative. We discussed return to or to clear this margin.   Procedure: After informed consent was obtained the patient was taken to the OR.She was given anitibiotics. SCDs were in place. She was prepped and draped in the standard sterile surgical fashion. A timeout was performed. I infiltrated marcaine and reentered her old incision. I used the lighted retractor and released all prior sutures.  I then identified the anterior margin and removed this in its entirety.  I marked this with paint.  I obtained hemostasis. I then closed with 2-0 vicryl, 3-0 vicryl and 4-0 monocryl. Glue and steristrips were applied. A breast binder was placed. She was extubated and transferred to recovery stable

## 2016-09-11 NOTE — H&P (View-Only) (Signed)
Victoria [name] is an 66 y.o. female.   Chief Complaint: breast cancer HPI:   47 yof referred by Dr Cari Caraway for screening detected right breast mass. she has fh in aunt at age 80. she has personal history of cyst aspirations. she was noted to have a 4 mm liq mass on the right side that is 5 mm on [place]. axilla is negative on [place]. biopsy was done showing a grade I IDC that is 100/100 % positive, her 2 negative and Ki is 2%. she has no mass or dc noted. she is here with her daughter to discuss options   Past Medical History:  Diagnosis Date  . Arthritis    neck  . Cancer ([place]) 07/2016   right breast cancer  . Depression   . No pertinent past medical history     Past Surgical History:  Procedure Laterality Date  . BACK SURGERY     diskectomy L5-6  . CESAREAN SECTION     triplets  . DILATION AND CURETTAGE OF UTERUS    . KNEE ARTHROSCOPY Right     Family History  Problem Relation Age of Onset  . Breast cancer Paternal Aunt   . Breast cancer Cousin    Social History:  reports that she has never smoked. She has never used smokeless tobacco. She reports that she drinks alcohol. She reports that she does not use drugs.  Allergies:  Allergies  Allergen Reactions  . Darvon [Propoxyphene Hcl]     hyperactivity  . Penicillins Itching    No prescriptions prior to admission.    No results found for this or any previous visit (from the past 48 hour(s)). No results found.  Review of Systems  Constitutional: Negative for fever.  All other systems reviewed and are negative.   Height 5\' 4"  (1.626 m), weight 67.6 kg (149 lb). Physical Exam  General Mental Status-Alert. Orientation-Oriented X3. Head and Neck Thyroid -Note: no thyromegaly, no thyroid mass. Eye Sclera/Conjunctiva - Bilateral-No scleral icterus. Chest and Lung Exam Chest and lung exam reveals -quiet, even and easy respiratory effort with no use of accessory muscles and on auscultation, normal  breath sounds, no adventitious sounds and normal vocal resonance. Breast Nipples-No Discharge. Breast Lump-No Palpable Breast Mass. Cardiovascular Cardiovascular examination reveals -normal heart sounds, regular rate and rhythm with no murmurs. Abdomen Note: soft nt/nd Lymphatic Head & Neck General Head & Neck Lymphatics: Bilateral - Description - Normal. Axillary General Axillary Region: Bilateral - Description - Normal. Note: no [place] adenopathy  Assessment/Plan BREAST CANCER OF LOWER-INNER QUADRANT OF RIGHT FEMALE BREAST (C50.311) Story: Right breast seed guided lumpectomy, right axillary sentinel node biopsy We discussed the staging and pathophysiology of breast cancer. We discussed all of the different options for treatment for breast cancer including surgery, chemotherapy, radiation therapy, Herceptin, and antiestrogen therapy. We discussed a sentinel lymph node biopsy as she does not appear to having lymph node involvement right now. We discussed that she would have an incision underneath her axillary hairline or would be done through the same incision. We discussed up to a 5% risk lifetime of chronic shoulder pain as well as lymphedema associated with a sentinel lymph node biopsy. We discussed the options for treatment of the breast cancer which included lumpectomy versus a mastectomy. We discussed the performance of the lumpectomy with radioactive seed placement. We discussed a 5-10% chance of a positive margin requiring reexcision in the operating room. We also discussed that she will likely need radiation therapy (this is  usually 5-7 weeks) if she undergoes lumpectomy. The breast cannot undergo more radiation therapy in the same breast after lumpectomy in the future. We discussed the mastectomy (removal of whole breast) and the postoperative care for that as well. Mastectomy can be followed by reconstruction. This is a more extensive surgery and requires more recovery. The  decision for lumpectomy vs mastectomy has no impact on decision for chemotherapy. Most mastectomy patients will not need radiation therapy. We discussed that there is no difference in her survival whether she undergoes lumpectomy with radiation therapy or antiestrogen therapy versus a mastectomy. There is also no real difference between her recurrence in the breast. We discussed the risks of operation including bleeding, infection, possible reoperation. She understands her further therapy will be based on what her stages at the time of her operation.  Rolm Bookbinder, MD 09/03/2016, 7:17 AM

## 2016-09-11 NOTE — Transfer of Care (Signed)
2Immediate Anesthesia Transfer of Care Note  Patient: Victoria [name]  Procedure(s) Performed: Procedure(s): RIGHT RE-EXCISION OF BREAST LUMPECTOMY (Right)  Patient Location: PACU  Anesthesia Type:General  Level of Consciousness: awake and alert   Airway & Oxygen Therapy: Patient Spontanous Breathing and Patient connected to nasal cannula oxygen  Post-op Assessment: Report given to RN and Post -op Vital signs reviewed and stable  Post vital signs: Reviewed and stable  Last Vitals:  Vitals:   09/11/16 0707 09/11/16 1020  BP: 108/63 107/69  Pulse: 87 93  Resp: 20 15  Temp: 36.7 C 36.6 C    Last Pain:  Vitals:   09/11/16 0707  TempSrc: Oral      Patients Stated Pain Goal: 1 (68/15/94 [id number])  Complications: No apparent anesthesia complications

## 2016-09-11 NOTE — Discharge Instructions (Signed)
[facility] °Office Phone Number [phone] ° °BREAST BIOPSY/ PARTIAL MASTECTOMY: POST OP INSTRUCTIONS ° °Always review your discharge instruction sheet given to you by the facility where your surgery was performed. ° °IF YOU HAVE DISABILITY OR FAMILY LEAVE FORMS, YOU MUST BRING THEM TO THE OFFICE FOR PROCESSING.  DO NOT GIVE THEM TO YOUR DOCTOR. ° °1. A prescription for pain medication may be given to you upon discharge.  Take your pain medication as prescribed, if needed.  If narcotic pain medicine is not needed, then you may take acetaminophen (Tylenol), naprosyn (Alleve) or ibuprofen (Advil) as needed. °2. Take your usually prescribed medications unless otherwise directed °3. If you need a refill on your pain medication, please contact your pharmacy.  They will contact our office to request authorization.  Prescriptions will not be filled after 5pm or on week-ends. °4. You should eat very light the first 24 hours after surgery, such as soup, crackers, pudding, etc.  Resume your normal diet the day after surgery. °5. Most patients will experience some swelling and bruising in the breast.  Ice packs and a good support bra will help.  Wear the breast binder provided or a sports bra for 72 hours day and night.  After that wear a sports bra during the day until you return to the office. Swelling and bruising can take several days to resolve.  °6. It is common to experience some constipation if taking pain medication after surgery.  Increasing fluid intake and taking a stool softener will usually help or prevent this problem from occurring.  A mild laxative (Milk of Magnesia or Miralax) should be taken according to package directions if there are no bowel movements after 48 hours. °7. Unless discharge instructions indicate otherwise, you may remove your bandages 48 hours after surgery and you may shower at that time.  You may have steri-strips (small skin tapes) in place directly over the incision.   These strips should be left on the skin for 7-10 days and will come off on their own.  If your surgeon used skin glue on the incision, you may shower in 24 hours.  The glue will flake off over the next 2-3 weeks.  Any sutures or staples will be removed at the office during your follow-up visit. °8. ACTIVITIES:  You may resume regular daily activities (gradually increasing) beginning the next day.  Wearing a good support bra or sports bra minimizes pain and swelling.  You may have sexual intercourse when it is comfortable. °a. You may drive when you no longer are taking prescription pain medication, you can comfortably wear a seatbelt, and you can safely maneuver your car and apply brakes. °b. RETURN TO WORK:  ______________________________________________________________________________________ °9. You should see your doctor in the office for a follow-up appointment approximately two weeks after your surgery.  Your doctor’s nurse will typically make your follow-up appointment when she calls you with your pathology report.  Expect your pathology report 3-4 business days after your surgery.  You may call to check if you do not hear from us after three days. °10. OTHER INSTRUCTIONS: _______________________________________________________________________________________________ _____________________________________________________________________________________________________________________________________ °_____________________________________________________________________________________________________________________________________ °_____________________________________________________________________________________________________________________________________ ° °WHEN TO CALL DR Libia Fazzini: °1. Fever over 101.0 °2. Nausea and/or vomiting. °3. Extreme swelling or bruising. °4. Continued bleeding from incision. °5. Increased pain, redness, or drainage from the incision. ° °The clinic staff is available to  answer your questions during regular business hours.  Please don’t hesitate to call and ask to speak to one of the nurses for   clinical concerns.  If you have a medical emergency, go to the nearest emergency room or call 911.  A surgeon from [facility] is always on call at the hospital. ° °For further questions, please visit [email or web address] ° °

## 2016-09-11 NOTE — Anesthesia Postprocedure Evaluation (Signed)
Anesthesia Post Note  Patient: Trysten Berti  Procedure(s) Performed: Procedure(s) (LRB): RIGHT RE-EXCISION OF BREAST LUMPECTOMY (Right)  Patient location during evaluation: PACU Anesthesia Type: General Level of consciousness: awake and alert Pain management: pain level controlled Vital Signs Assessment: post-procedure vital signs reviewed and stable Respiratory status: spontaneous breathing, nonlabored ventilation and respiratory function stable Cardiovascular status: blood pressure returned to baseline and stable Postop Assessment: no signs of nausea or vomiting Anesthetic complications: no       Last Vitals:  Vitals:   09/11/16 1035 09/11/16 1050  BP: 108/74 118/72  Pulse: 82 85  Resp: 17 16  Temp: 36.1 C     Last Pain:  Vitals:   09/11/16 1050  TempSrc:   PainSc: 0-No pain                 Lynda Rainwater

## 2016-09-12 ENCOUNTER — Encounter (HOSPITAL_COMMUNITY): Payer: Self-pay | Admitting: General Surgery

## 2016-09-12 NOTE — Addendum Note (Signed)
Addendum  created 09/12/16 1356 by Eligha Bridegroom, CRNA   Anesthesia Event edited

## 2016-09-12 NOTE — Addendum Note (Signed)
Addendum  created 09/12/16 1316 by Lynda Rainwater, MD   Anesthesia Staff edited

## 2016-09-13 NOTE — Progress Notes (Addendum)
Location of Breast Cancer: Lower-inner quadrant of right breast   Histology per Pathology Report: 09-11-16 Diagnosis Breast, excision, Right breast anterior margin - BENIGN BREAST TISSUE WITH PREVIOUS BIOPSY SITE CHANGES. - NO RESIDUAL CARCINOMA IDENTIFIED. - FINAL ANTERIOR MARGIN CLEAR  Diagnosis 09-03-16 1. Breast, lumpectomy, Right - INVASIVE AND IN SITU DUCTAL CARCINOMA, 0.6 CM. - INVASIVE CARCINOMA BROADLY INVOLVES THE ANTERIOR MARGIN. - PREVIOUS BIOPSY SITE. 2. Lymph node, sentinel, biopsy, Right Axillary - ONE BENIGN LYMPH NODE (0/1). 3. Lymph node, sentinel, biopsy, Right - ONE BENIGN LYMPH NODE (0/1). 4. Lymph node, sentinel, biopsy, Right - ONE BENIGN LYMPH NODE (0/1). 5. Lymph node, sentinel, biopsy, Right - ONE BENIGN LYMPH NODE (0/1). 6. Lymph node, sentinel, biopsy, Right - ONE BENIGN LYMPH NODE (0/1). 7. Lymph node, sentinel, biopsy, Right - ONE BENIGN LYMPH NODE (0/1). 8. Lymph node, sentinel, biopsy, Right - ONE BENIGN LYMPH NODE (0/1).  Receptor Status: ER(100% +), PR (100% +), Her2-neu (-), Ki-67 (2%)  Did patient present with symptoms (if so, please note symptoms) or was this found on screening mammography?: Diagnostic mammography   Past/Anticipated interventions by surgeon, if any: 09-11-16 Dr. Rolm Bookbinder Breast, excision, Right breast anterior margin, 09-03-16 Breast, lumpectomy,08-13-16 Right,breast biopsy   Past/Anticipated interventions by medical oncology, if any:08-22-16  [place] Breast conserving surgery followed by radiation, then consideration of anti-estrogens.  Lymphedema issues, if any:  No  Pain issues, if any:  None  SAFETY ISSUES:  Prior radiation? : No  Pacemaker/ICD? No:  Possible current pregnancy?: No  Is the patient on methotrexate? No  Menarche age 70, first live birth age 4. The patient is G3 P3 triplets. She stopped having periods in 2004. She took hormone replacement until 2018, when she was diagnosed with breast  cancer.  Current Complaints / other details:  66 year old single woman Skin to right breast with steri strips and mild erythema under the steri strips tender to the breast.  Rash upper arm and forarms thinks it maybe poison ivy. Wt Readings from Last 3 Encounters:  09/20/16 149 lb 12.8 oz (67.9 kg)  09/11/16 146 lb (66.2 kg)  09/03/16 146 lb 9 oz (66.5 kg)  BP 102/63   Pulse 75   Temp 97.8 F (36.6 C) (Oral)   Resp 18   Ht '5\' 4"'$  (1.626 m)   Wt 149 lb 12.8 oz (67.9 kg)   SpO2 100%   BMI 25.71 kg/m    Georgena Spurling, RN 09/13/2016,1:11 PM

## 2016-09-20 ENCOUNTER — Ambulatory Visit
Admission: RE | Admit: 2016-09-20 | Discharge: 2016-09-20 | Disposition: A | Discharge status: Home / Self Care | Payer: BC Managed Care – PPO | Source: Ambulatory Visit | Attending: Radiation Oncology | Admitting: Radiation Oncology

## 2016-09-20 ENCOUNTER — Encounter: Payer: Self-pay | Admitting: Radiation Oncology

## 2016-09-20 ENCOUNTER — Other Ambulatory Visit: Payer: Self-pay | Admitting: Radiation Oncology

## 2016-09-20 VITALS — BP 102/63 | HR 75 | Temp 97.8°F | Resp 18 | Ht 64.0 in | Wt 149.8 lb

## 2016-09-20 DIAGNOSIS — C50311 Malignant neoplasm of lower-inner quadrant of right female breast: Secondary | ICD-10-CM

## 2016-09-20 DIAGNOSIS — Z9889 Other specified postprocedural states: Secondary | ICD-10-CM | POA: Diagnosis not present

## 2016-09-20 DIAGNOSIS — F329 Major depressive disorder, single episode, unspecified: Secondary | ICD-10-CM | POA: Diagnosis not present

## 2016-09-20 DIAGNOSIS — Z803 Family history of malignant neoplasm of breast: Secondary | ICD-10-CM | POA: Diagnosis not present

## 2016-09-20 DIAGNOSIS — Z170 Estrogen receptor positive status [ER+]: Principal | ICD-10-CM

## 2016-09-20 DIAGNOSIS — M1990 Unspecified osteoarthritis, unspecified site: Secondary | ICD-10-CM | POA: Diagnosis not present

## 2016-09-20 DIAGNOSIS — Z880 Allergy status to penicillin: Secondary | ICD-10-CM | POA: Diagnosis not present

## 2016-09-20 DIAGNOSIS — Z888 Allergy status to other drugs, medicaments and biological substances status: Secondary | ICD-10-CM | POA: Diagnosis not present

## 2016-09-20 DIAGNOSIS — Z510 Encounter for antineoplastic radiation therapy: Secondary | ICD-10-CM | POA: Diagnosis not present

## 2016-09-20 DIAGNOSIS — Z79899 Other long term (current) drug therapy: Secondary | ICD-10-CM | POA: Diagnosis not present

## 2016-09-20 NOTE — Addendum Note (Signed)
Encounter addended by: Malena Edman, RN on: 09/20/2016  4:07 PM<BR>    Actions taken: Charge Capture section accepted

## 2016-09-20 NOTE — Progress Notes (Signed)
Radiation Oncology         (336) [phone] ________________________________  Name: Victoria [name] MRN: [id number]  Date: 09/20/2016  DOB: [birth year]  CC:Victoria Caraway, MD  Rolm Bookbinder, MD     REFERRING PHYSICIAN: Rolm Bookbinder, MD   DIAGNOSIS: There were no encounter diagnoses.   HISTORY OF PRESENT ILLNESS: Victoria [name] is a 66 y.o. female with a right breast cancer. The patient was found to have screening detected asymmetry which prompted her to undergo diagnostic imaging. There was a screening detection abnormality in the lower inner quadrant of the right breast at 4:00. Ultrasound measured is abnormality at 4 mm, and her ultrasound of the axilla was negative. She underwent a biopsy on 08/13/16 which revealed a grade 1 invasive ductal carcinoma of the right breast, ER/PR positive, HER-2 negative. Ki-67 was 2%. Was offered lumpectomy and underwent this of the right breast with sentinel node evaluation on 09/03/16 revealing a 6 mm tumor, grade 1 IDC and DCIS with carcinoma involving the anterior margin. She underwent re-resection of her lumpectomy cavity for margins on 09/11/16 which revealed benign breast tissue. She comes today to discuss options of adjuvant radiotherapy.   PREVIOUS RADIATION THERAPY: No   PAST MEDICAL HISTORY:  Past Medical History:  Diagnosis Date  . Arthritis    neck  . Cancer ([place]) 07/2016   right breast cancer  . Depression   . Family history of adverse reaction to anesthesia    mother had cardiac arrest and PONV  . Headache    migraine  . History of kidney stones   . No pertinent past medical history        PAST SURGICAL HISTORY: Past Surgical History:  Procedure Laterality Date  . BACK SURGERY     diskectomy L5-6  . BREAST LUMPECTOMY WITH RADIOACTIVE SEED AND SENTINEL LYMPH NODE BIOPSY Right 09/03/2016   Procedure: RIGHT BREAST LUMPECTOMY WITH RADIOACTIVE SEED AND RIGHT AXILLARY SENTINEL NODE BIOPSY;  Surgeon: Rolm Bookbinder, MD;  Location:  [place];  Service: General;  Laterality: Right;  . CESAREAN SECTION     triplets  . DILATION AND CURETTAGE OF UTERUS    . KNEE ARTHROSCOPY Right   . RE-EXCISION OF BREAST LUMPECTOMY Right 09/11/2016   Procedure: RIGHT RE-EXCISION OF BREAST LUMPECTOMY;  Surgeon: Rolm Bookbinder, MD;  Location: [place];  Service: General;  Laterality: Right;     FAMILY HISTORY:  Family History  Problem Relation Age of Onset  . Breast cancer Paternal Aunt   . Breast cancer Cousin      SOCIAL HISTORY:  reports that she has never smoked. She has never used smokeless tobacco. She reports that she drinks alcohol. She reports that she does not use drugs. The patient is single and lives in [place]. She works in the court system.   ALLERGIES: Darvon [propoxyphene hcl] and Penicillins   MEDICATIONS:  Current Outpatient Prescriptions  Medication Sig Dispense Refill  . acetaminophen (TYLENOL) 500 MG tablet Take 1,000 mg by mouth every 6 (six) hours as needed for mild pain or headache.    . calcium-vitamin D (OSCAL WITH D) 250-125 MG-UNIT tablet Take 1 tablet by mouth daily.    . cholecalciferol (VITAMIN D) 1000 UNITS tablet Take 1,000 Units by mouth daily.     . clonazePAM (KLONOPIN) 0.5 MG tablet Take 0.5-1 mg by mouth at bedtime as needed (sleep).     . gabapentin (NEURONTIN) 300 MG capsule Take 1 capsule (300 mg total) by mouth at bedtime. 90 capsule  4  . glucosamine-chondroitin 500-400 MG tablet Take 1 tablet by mouth 2 (two) times daily.     . multivitamin-iron-minerals-folic acid (CENTRUM) chewable tablet Chew 1 tablet by mouth daily.    Marland Kitchen oxyCODONE-acetaminophen (PERCOCET) 10-325 MG tablet Take 1 tablet by mouth every 6 (six) hours as needed for pain. 10 tablet 0  . sertraline (ZOLOFT) 100 MG tablet Take 150 mg by mouth daily.    . vitamin E 400 UNIT capsule Take 400 Units by mouth daily.     No current facility-administered medications for this encounter.      REVIEW OF  SYSTEMS: On review of systems, the patient reports that she is doing well overall. She denies any chest pain, shortness of breath, cough, fevers, chills, night sweats, unintended weight changes. She denies any bowel or bladder disturbances, and denies abdominal pain, nausea or vomiting. She denies any new musculoskeletal or joint aches or pains, new skin lesions or concerns. A complete review of systems is obtained and is otherwise negative.    PHYSICAL EXAM:  Wt Readings from Last 3 Encounters:  09/11/16 146 lb (66.2 kg)  09/03/16 146 lb 9 oz (66.5 kg)  08/22/16 149 lb 12.8 oz (67.9 kg)   Temp Readings from Last 3 Encounters:  09/11/16 97 F (36.1 C)  09/03/16 97.7 F (36.5 C) (Oral)  08/22/16 98 F (36.7 C) (Oral)   BP Readings from Last 3 Encounters:  09/11/16 118/72  09/03/16 125/75  08/22/16 (!) 121/59   Pulse Readings from Last 3 Encounters:  09/11/16 85  09/03/16 79  08/22/16 64   In general this is a well appearing caucasian female in no acute distress. She's alert and oriented x4 and appropriate throughout the examination. Cardiopulmonary assessment is negative for acute distress and she exhibits normal effort. The right breast lumpectomy incision is intact. No erythema is noted of the breast or axillary site. No edema of the chest wall is noted.    ECOG = 0  0 - Asymptomatic (Fully active, able to carry on all predisease activities without restriction)  1 - Symptomatic but completely ambulatory (Restricted in physically strenuous activity but ambulatory and able to carry out work of a light or sedentary nature. For example, light housework, office work)  2 - Symptomatic, <50% in bed during the day (Ambulatory and capable of all self care but unable to carry out any work activities. Up and about more than 50% of waking hours)  3 - Symptomatic, >50% in bed, but not bedbound (Capable of only limited self-care, confined to bed or chair 50% or more of waking hours)  4 -  Bedbound (Completely disabled. Cannot carry on any self-care. Totally confined to bed or chair)  5 - Death   Eustace Pen MM, Creech RH, Tormey DC, et al. [phone]). "Toxicity and response criteria of the [facility]". [place] Oncol. 5 (6): 649-55    LABORATORY DATA:  Lab Results  Component Value Date   WBC 7.0 09/11/2016   HGB 12.2 09/11/2016   HCT 38.2 09/11/2016   MCV 91.2 09/11/2016   PLT 178 09/11/2016   Lab Results  Component Value Date   NA 139 09/11/2016   K 4.0 09/11/2016   CL 107 09/11/2016   CO2 21 (L) 09/11/2016   Lab Results  Component Value Date   ALT 29 08/22/2016   AST 29 08/22/2016   ALKPHOS 85 08/22/2016   BILITOT 0.35 08/22/2016      RADIOGRAPHY: No results found.  IMPRESSION/PLAN: 1. Stage IA, T1a, N0, MX ER/PR positive grade 1 invasive ductal carcinoma with DCIS of the right breast. Dr. Lisbeth Renshaw discusses the pathology findings. Her tumor was small and oncotype was not necessary. She is now ready to move forward with adjuvant external radiotherapy to the breast followed by antiestrogen therapy. We discussed the risks, benefits, short, and long term effects of radiotherapy, and the patient is interested in proceeding. Dr. Lisbeth Renshaw discusses the delivery and logistics of radiotherapy and would anticipate a course of 4 weeks of treatment.  We will plan for simulation on 10/02/16 at 3pm with treatment to begin the following week. Written consent is obtained, placed in her chart, and a copy provided to the patient.   In a visit lasting 25 minutes, greater than 50% of the time was spent face to face discussing the role of radiotherapy, and coordinating the patient's care.   The above documentation reflects my direct findings during this shared patient visit. Please see the separate note by Dr. Lisbeth Renshaw on this date for the remainder of the patient's plan of care.    Carola Rhine, PAC

## 2016-10-02 ENCOUNTER — Ambulatory Visit
Admission: RE | Admit: 2016-10-02 | Discharge: 2016-10-02 | Disposition: A | Discharge status: Home / Self Care | Payer: BC Managed Care – PPO | Source: Ambulatory Visit | Attending: Radiation Oncology | Admitting: Radiation Oncology

## 2016-10-02 DIAGNOSIS — Z510 Encounter for antineoplastic radiation therapy: Secondary | ICD-10-CM | POA: Diagnosis not present

## 2016-10-02 DIAGNOSIS — Z170 Estrogen receptor positive status [ER+]: Principal | ICD-10-CM

## 2016-10-02 DIAGNOSIS — C50311 Malignant neoplasm of lower-inner quadrant of right female breast: Secondary | ICD-10-CM

## 2016-10-02 NOTE — Progress Notes (Signed)
  Radiation Oncology         (336) [phone] ________________________________  Name: Victoria [name] MRN: [id number]  Date: 10/02/2016  DOB: 08-31-1950  Optical Surface Tracking Plan:  Since intensity modulated radiotherapy (IMRT) and 3D conformal radiation treatment methods are predicated on accurate and precise positioning for treatment, intrafraction motion monitoring is medically necessary to ensure accurate and safe treatment delivery.  The ability to quantify intrafraction motion without excessive ionizing radiation dose can only be performed with optical surface tracking. Accordingly, surface imaging offers the opportunity to obtain 3D measurements of patient position throughout IMRT and 3D treatments without excessive radiation exposure.  I am ordering optical surface tracking for this patient's upcoming course of radiotherapy. ________________________________  Kyung Rudd, MD 10/02/2016 5:56 PM    Reference:   Ursula Alert, J, et al. Surface imaging-based analysis of intrafraction motion for breast radiotherapy patients.Journal of [place], n. 6, nov. 2014. ISSN [id number].   Available at: <http://www.jacmp.org/index.php/jacmp/article/view/4957>.

## 2016-10-02 NOTE — Progress Notes (Addendum)
  Radiation Oncology         [phone] ________________________________  Name: Victoria [name] MRN: [id number]  Date: 10/02/2016  DOB: 1951/02/20   DIAGNOSIS:     ICD-10-CM   1. Malignant neoplasm of lower-inner quadrant of right breast of female, estrogen receptor positive ([place]) C50.311    Z17.0     SIMULATION AND TREATMENT PLANNING NOTE  The patient presented for simulation prior to beginning her course of radiation treatment for her diagnosis of left-sided breast cancer. The patient was placed in a supine position on a breast board. A customized vac-lock bag was constructed and this complex treatment device will be used on a daily basis during her treatment. In this fashion, a CT scan was obtained through the chest area and an isocenter was placed near the chest wall within the breast.  The patient will be planned to receive a course of radiation initially to a dose of 42.5 Gy. This will consist of a whole breast radiotherapy technique. To accomplish this, 2 customized blocks have been designed which will correspond to medial and lateral whole breast tangent fields. This treatment will be accomplished at 2.5 Gy per fraction. A forward planning technique will also be evaluated to determine if this approach improves the plan. It is anticipated that the patient will then receive a 7.5 Gy boost to the seroma cavity which has been contoured. This will be accomplished at 2.5 Gy per fraction.   This initial treatment will consist of a 3-D conformal technique. The seroma has been contoured as the primary target structure. Additionally, dose volume histograms of both this target as well as the lungs and heart will also be evaluated. Such an approach is necessary to ensure that the target area is adequately covered while the nearby critical  normal structures are adequately spared.  Plan:  The final anticipated total dose therefore will correspond to 50  Gy.    _______________________________   Jodelle Gross, MD, PhD

## 2016-10-02 NOTE — Addendum Note (Signed)
Encounter addended by: Kyung Rudd, MD on: 10/02/2016  5:57 PM<BR>    Actions taken: Sign clinical note

## 2016-10-03 DIAGNOSIS — Z510 Encounter for antineoplastic radiation therapy: Secondary | ICD-10-CM | POA: Diagnosis not present

## 2016-10-09 ENCOUNTER — Ambulatory Visit
Admission: RE | Admit: 2016-10-09 | Discharge: 2016-10-09 | Disposition: A | Discharge status: Home / Self Care | Payer: BC Managed Care – PPO | Source: Ambulatory Visit | Attending: Radiation Oncology | Admitting: Radiation Oncology

## 2016-10-09 DIAGNOSIS — Z510 Encounter for antineoplastic radiation therapy: Secondary | ICD-10-CM | POA: Diagnosis not present

## 2016-10-10 ENCOUNTER — Ambulatory Visit
Admission: RE | Admit: 2016-10-10 | Discharge: 2016-10-10 | Disposition: A | Discharge status: Home / Self Care | Payer: BC Managed Care – PPO | Source: Ambulatory Visit | Attending: Radiation Oncology | Admitting: Radiation Oncology

## 2016-10-10 DIAGNOSIS — Z170 Estrogen receptor positive status [ER+]: Principal | ICD-10-CM

## 2016-10-10 DIAGNOSIS — Z510 Encounter for antineoplastic radiation therapy: Secondary | ICD-10-CM | POA: Diagnosis not present

## 2016-10-10 DIAGNOSIS — C50311 Malignant neoplasm of lower-inner quadrant of right female breast: Secondary | ICD-10-CM

## 2016-10-10 MED ORDER — ALRA NON-METALLIC DEODORANT (RAD-ONC)
1.0000 "application " | Freq: Once | TOPICAL | Status: AC
  Administered 2016-10-10: 1 via TOPICAL

## 2016-10-10 MED ORDER — RADIAPLEXRX EX GEL
Freq: Once | CUTANEOUS | Status: AC
  Administered 2016-10-10: 18:00:00 via TOPICAL

## 2016-10-10 NOTE — Progress Notes (Signed)
Patient education done, radiation therapy and you book, my business card, alra deodorant, radiaplex gel,discussed side effects,fatigue, skin irritation of breast,, short sharp pains, swelling, tenderness, lukewarm showers/bath, dove soap unscented preferred, pat dry,no rubbing,scrubbing or scratching breast, apply skin products after rad tx daily and bedtime, no underwire bras, electric shaver only, get plenty sleep, rest and exercise also, no heat pads or ice packs, use sunscreen, increase protein and extra fluids,water in diet,stay hydrated, sees MD weekly and prn

## 2016-10-11 ENCOUNTER — Ambulatory Visit
Admission: RE | Admit: 2016-10-11 | Discharge: 2016-10-11 | Disposition: A | Discharge status: Home / Self Care | Payer: BC Managed Care – PPO | Source: Ambulatory Visit | Attending: Radiation Oncology | Admitting: Radiation Oncology

## 2016-10-11 ENCOUNTER — Ambulatory Visit: Payer: Self-pay | Admitting: Physical Therapy

## 2016-10-11 DIAGNOSIS — Z510 Encounter for antineoplastic radiation therapy: Secondary | ICD-10-CM | POA: Diagnosis not present

## 2016-10-12 ENCOUNTER — Ambulatory Visit
Admission: RE | Admit: 2016-10-12 | Discharge: 2016-10-12 | Disposition: A | Discharge status: Home / Self Care | Payer: BC Managed Care – PPO | Source: Ambulatory Visit | Attending: Radiation Oncology | Admitting: Radiation Oncology

## 2016-10-12 DIAGNOSIS — Z510 Encounter for antineoplastic radiation therapy: Secondary | ICD-10-CM | POA: Diagnosis not present

## 2016-10-15 ENCOUNTER — Ambulatory Visit
Admission: RE | Admit: 2016-10-15 | Discharge: 2016-10-15 | Disposition: A | Discharge status: Home / Self Care | Payer: BC Managed Care – PPO | Source: Ambulatory Visit | Attending: Radiation Oncology | Admitting: Radiation Oncology

## 2016-10-15 DIAGNOSIS — Z510 Encounter for antineoplastic radiation therapy: Secondary | ICD-10-CM | POA: Diagnosis not present

## 2016-10-16 ENCOUNTER — Ambulatory Visit: Payer: BC Managed Care – PPO | Attending: General Surgery | Admitting: Physical Therapy

## 2016-10-16 ENCOUNTER — Ambulatory Visit (HOSPITAL_BASED_OUTPATIENT_CLINIC_OR_DEPARTMENT_OTHER): Payer: BC Managed Care – PPO | Admitting: Oncology

## 2016-10-16 ENCOUNTER — Ambulatory Visit
Admission: RE | Admit: 2016-10-16 | Discharge: 2016-10-16 | Disposition: A | Discharge status: Home / Self Care | Payer: BC Managed Care – PPO | Source: Ambulatory Visit | Attending: Radiation Oncology | Admitting: Radiation Oncology

## 2016-10-16 VITALS — BP 114/73 | HR 82 | Temp 98.3°F | Resp 18 | Ht 64.0 in | Wt 151.4 lb

## 2016-10-16 DIAGNOSIS — Z510 Encounter for antineoplastic radiation therapy: Secondary | ICD-10-CM | POA: Diagnosis not present

## 2016-10-16 DIAGNOSIS — Z170 Estrogen receptor positive status [ER+]: Secondary | ICD-10-CM

## 2016-10-16 DIAGNOSIS — C50311 Malignant neoplasm of lower-inner quadrant of right female breast: Secondary | ICD-10-CM

## 2016-10-16 DIAGNOSIS — Z483 Aftercare following surgery for neoplasm: Secondary | ICD-10-CM | POA: Insufficient documentation

## 2016-10-16 DIAGNOSIS — R293 Abnormal posture: Secondary | ICD-10-CM | POA: Insufficient documentation

## 2016-10-16 MED ORDER — GABAPENTIN 300 MG PO CAPS: 300.0000 mg | ORAL_CAPSULE | Freq: Every day | ORAL | 4 refills | Status: DC

## 2016-10-16 MED ORDER — TAMOXIFEN CITRATE 20 MG PO TABS: 20.0000 mg | ORAL_TABLET | Freq: Every day | ORAL | 12 refills | Status: AC

## 2016-10-16 NOTE — Therapy (Signed)
[place], [place], [address] Phone: [phone]   Fax:  [phone]  Physical Therapy Evaluation  Patient Details  Name: Victoria [name] MRN: [id number] Date of Birth: 03/22/1951 Referring Provider: Dr. Donne Hazel   Encounter Date: 10/16/2016      PT End of Session - 10/16/16 1748    Visit Number 1   Number of Visits 1   PT Start Time 1610   PT Stop Time 1645   PT Time Calculation (min) 35 min   Activity Tolerance Patient tolerated treatment well   Behavior During Therapy [facility] for tasks assessed/performed      Past Medical History:  Diagnosis Date  . Arthritis    neck  . Cancer ([place]) 07/2016   right breast cancer  . Depression   . Family history of adverse reaction to anesthesia    mother had cardiac arrest and PONV  . Headache    migraine  . History of kidney stones   . No pertinent past medical history     Past Surgical History:  Procedure Laterality Date  . BACK SURGERY     diskectomy L5-6  . BREAST LUMPECTOMY WITH RADIOACTIVE SEED AND SENTINEL LYMPH NODE BIOPSY Right 09/03/2016   Procedure: RIGHT BREAST LUMPECTOMY WITH RADIOACTIVE SEED AND RIGHT AXILLARY SENTINEL NODE BIOPSY;  Surgeon: Rolm Bookbinder, MD;  Location: [place];  Service: General;  Laterality: Right;  . CESAREAN SECTION     triplets  . DILATION AND CURETTAGE OF UTERUS    . KNEE ARTHROSCOPY Right   . RE-EXCISION OF BREAST LUMPECTOMY Right 09/11/2016   Procedure: RIGHT RE-EXCISION OF BREAST LUMPECTOMY;  Surgeon: Rolm Bookbinder, MD;  Location: [place];  Service: General;  Laterality: Right;    There were no vitals filed for this visit.       Subjective Assessment - 10/16/16 1613    Subjective Pt reports she has always had problems with her neck and has been huring constantly since last June.  She has had a round of cortisone that didn't help. She is currently seeing a chiropractor for treatment     Pertinent History Patient was diagnosed on 08/13/16 with right grade 1 invasive ductal carcinoma breast cancer. It measures 4 mm and is located in the lower inner quadrant, is ER/PR positive and HER2 negative with a Ki67 of 2%.  She underwent a lumpectomy with 8 nodes removed on 5/14 2018 and had to go for second surgery on 09/11/2016    Patient Stated Goals Dr. Donne Hazel asked me to come    Currently in Pain? Yes   Pain Score 4    Pain Location Neck   Pain Orientation Right   Pain Type Chronic pain   Pain Onset More than a month ago   Pain Frequency Constant   Aggravating Factors  tension , stress   Pain Relieving Factors currently undergoing chiropractics             [facility] PT Assessment - 10/16/16 0001      Assessment   Medical Diagnosis Right breast cancer   Referring Provider Dr. Donne Hazel    Onset Date/Surgical Date 08/13/16   Hand Dominance Right   Prior Therapy none     Precautions   Precautions Other (comment)   Precaution Comments active cancer     Restrictions   Weight Bearing Restrictions No     Balance Screen   Has the patient fallen in the past 6 months Yes   How  many times? 1  will not be addressed this visit    Has the patient had a decrease in activity level because of a fear of falling?  No   Is the patient reluctant to leave their home because of a fear of falling?  No     Home [place] residence   Living Arrangements Spouse/significant other   Available Help at Discharge Family     Prior Function   Level of [place] Full time employment   Journalist, newspaper for court; desk job   Leisure She does gardening, back to walking, used to do weight treaining      Cognition   Overall Cognitive Status Within Functional Limits for tasks assessed     Observation/Other Assessments   Observations pt comes in rushed from radiation visits and working,   Quick DASH  4.55      Coordination   Gross Motor Movements are Fluid and Coordinated Yes     Posture/Postural Control   Posture/Postural Control Postural limitations   Postural Limitations Rounded Shoulders;Forward head     AROM   Overall AROM  Within functional limits for tasks performed  some limitation in her neck due to pain    Right Shoulder Extension --   Right Shoulder Flexion --   Right Shoulder ABduction --   Right Shoulder Internal Rotation --   Right Shoulder External Rotation --   Left Shoulder Extension --   Left Shoulder Flexion --   Left Shoulder ABduction --   Left Shoulder Internal Rotation --   Left Shoulder External Rotation --   Cervical Flexion --   Cervical Extension --   Cervical - Right Side Bend --   Cervical - Left Side Bend --   Cervical - Right Rotation --   Cervical - Left Rotation --     Strength   Overall Strength Within functional limits for tasks performed              Quick Dash - 10/16/16 0001    Open a tight or new jar Mild difficulty   Do heavy household chores (wash walls, wash floors) No difficulty   Carry a shopping bag or briefcase No difficulty   Wash your back No difficulty   Use a knife to cut food No difficulty   Recreational activities in which you take some force or impact through your arm, shoulder, or hand (golf, hammering, tennis) No difficulty   During the past week, to what extent has your arm, shoulder or hand problem interfered with your normal social activities with family, friends, neighbors, or groups? Not at all   During the past week, to what extent has your arm, shoulder or hand problem limited your work or other regular daily activities Not at all   Arm, shoulder, or hand pain. Mild   Tingling (pins and needles) in your arm, shoulder, or hand None   Difficulty Sleeping No difficulty   DASH Score 4.55 %      Objective measurements completed on examination: See above findings.                  PT Education -  10/16/16 1739    Education provided Yes   Education Details Lymphedema risk reduction and shoulder ROM, where to get a compression sleeve, how to progress strengthening program once she starts one, [employer] program    Person(s) Educated Patient   Methods Explanation;Handout   Comprehension  Verbalized understanding              Breast Clinic Goals - 08/22/16 1104      Patient will be able to verbalize understanding of pertinent lymphedema risk reduction practices relevant to her diagnosis specifically related to skin care.   Time 1   Period Days   Status Achieved     Patient will be able to return demonstrate and/or verbalize understanding of the post-op home exercise program related to regaining shoulder range of motion.   Time 1   Period Days   Status Achieved     Patient will be able to verbalize understanding of the importance of attending the postoperative After Breast Cancer Class for further lymphedema risk reduction education and therapeutic exercise.   Time 1   Period Days   Status Achieved          Long Term Clinic Goals - 10/16/16 1753      CC Long Term Goal  #1   Title Patient with verbalize an understanding of lymphedema risk reduction precautions   Time 1   Period Days   Status Achieved     CC Long Term Goal  #2   Title pt will know where to get a compression sleeve for prophylaxis    Time 1   Period Days   Status Achieved             Plan - 10/16/16 1748    Clinical Impression Statement Pt reports she is not having any trouble with her shoulder and is receiving treatment on her neck from the chiropractor.  Educated her on lymphedema risk reduction and cautioned her to limit the use of heat at that clinic. She understood about protecting her arm during gardening.  She was given information to get a compression sleeve for flying. Encouraged pt to continue walking through radiation and to do a gradually progressive strength program when that was  finished to decrease lymphedema risk.  She was informed about the [employer] program  She said she was familiar with strength training as she has done it in the past.  She knows that she can call us if she has needs in the future but does not need PT follow up at this time.    History and Personal Factors relevant to plan of care: 8 lymph nodes removed    Clinical Presentation Evolving   Clinical Presentation due to: radiation    Clinical Decision Making Low   Rehab Potential Excellent   Clinical Impairments Affecting Rehab Potential None   PT Frequency One time visit   PT Treatment/Interventions Patient/family education;Therapeutic exercise   PT Next Visit Plan No PT folllow up at this time , pt to continue treatment for neck pain at the chiropractor    Consulted and Agree with Plan of Care Patient      Patient will benefit from skilled therapeutic intervention in order to improve the following deficits and impairments:  Postural dysfunction, Decreased knowledge of precautions  Visit Diagnosis: Abnormal posture - Plan: PT plan of care cert/re-cert  Aftercare following surgery for neoplasm - Plan: PT plan of care cert/re-cert     Problem List Patient Active Problem List   Diagnosis Date Noted  . Malignant neoplasm of lower-inner quadrant of right breast of female, estrogen receptor positive ([place]) 08/20/2016   Donato Heinz. Owens Shark PT  Norwood Levo 10/16/2016, 5:56 PM  [place], [place], [address] Phone: [phone]  Fax:  [phone]  Name: Victoria [name] MRN: [id number] Date of Birth: 1951-02-14

## 2016-10-16 NOTE — Progress Notes (Signed)
[facility]  [phone] Fax:([phone]) [phone]     ID: Victoria [name] DOB: 11/14/1950  MR#: [id number]  KYH#:[id number]  Patient Care Team: Cari Caraway, MD as PCP - General (Family Medicine) Magrinat, Virgie Dad, MD as Consulting Physician (Oncology) Rolm Bookbinder, MD as Consulting Physician (General Surgery) Kyung Rudd, MD as Consulting Physician (Radiation Oncology) Christene Slates, MD as Physician Assistant (Radiology) Chauncey Cruel, MD OTHER MD:  CHIEF COMPLAINT: Estrogen receptor positive breast cancer  CURRENT TREATMENT: Currently undergoing adjuvant radiation   BREAST CANCER HISTORY: Victoria [name] had routine bilateral screening mammography at [facility] 07/20/2016. The breast density was category B. In the right breast lower inner quadrant there was an oval mass associated with architectural distortion. On 08/02/2016 the patient was brought back for further imaging. Right diagnostic mammography confirmed a 0.5 cm all nodule in the right breast at the 4:00 radiant. By ultrasonography this measured 0.4 cm and had lobulated margins.  Biopsy of the right breast mass in question 08/13/2016 showed ([id number]) invasive ductal carcinoma, grade 1, estrogen receptor 100% positive, progesterone receptor 100% positive, both with strong staining intensity, with an MIB-1 of 2%, and no HER-2 notification, the signals ratio being 1.27 and the number per cell 2.10.  The patient's subsequent history is as detailed below  INTERVAL HISTORY: Victoria [name] returns today for follow-up of her estrogen receptor positive breast cancer. Since her last visit here she underwent right lumpectomy and sentinel lymph node sampling, on 09/03/2016. This showed ([id number]) invasive ductal carcinoma and in situ carcinoma measuring 0.6 cm. A total of 7 lymph nodes examined were clear. The right anterior margin was positive. This was cleared with subsequent surgery 09/11/2016. Ashok Croon [id number])   She has  just started her adjuvant radiation scheduled to be completed 11/07/2016.   REVIEW OF SYSTEMS: She did well with her surgery, with no significant problems regarding pain bleeding or fever. Rating radiation well so far. He detailed review of systems today was otherwise stable  PAST MEDICAL HISTORY: Past Medical History:  Diagnosis Date  . Arthritis    neck  . Cancer ([place]) 07/2016   right breast cancer  . Depression   . Family history of adverse reaction to anesthesia    mother had cardiac arrest and PONV  . Headache    migraine  . History of kidney stones   . No pertinent past medical history     PAST SURGICAL HISTORY: Past Surgical History:  Procedure Laterality Date  . BACK SURGERY     diskectomy L5-6  . BREAST LUMPECTOMY WITH RADIOACTIVE SEED AND SENTINEL LYMPH NODE BIOPSY Right 09/03/2016   Procedure: RIGHT BREAST LUMPECTOMY WITH RADIOACTIVE SEED AND RIGHT AXILLARY SENTINEL NODE BIOPSY;  Surgeon: Rolm Bookbinder, MD;  Location: [place];  Service: General;  Laterality: Right;  . CESAREAN SECTION     triplets  . DILATION AND CURETTAGE OF UTERUS    . KNEE ARTHROSCOPY Right   . RE-EXCISION OF BREAST LUMPECTOMY Right 09/11/2016   Procedure: RIGHT RE-EXCISION OF BREAST LUMPECTOMY;  Surgeon: Rolm Bookbinder, MD;  Location: [place];  Service: General;  Laterality: Right;    FAMILY HISTORY Family History  Problem Relation Age of Onset  . Breast cancer Paternal Aunt   . Breast cancer Cousin   The patient's father died from complications of emphysema at age 31. The patient's mother died following a stroke at age 61. The patient had no brothers, 1 sister. A paternal aunt had breast cancer in her 71s and a paternal  second cousin breast cancer in her 28s. There is no history of ovarian cancer in the family  GYNECOLOGIC HISTORY:  No LMP recorded. Patient is postmenopausal. Menarche age 50, first live birth age 22. The patient is GX P3. She stopped having periods  in 2004. She took hormone replacement until 2018, when she was diagnosed with breast cancer.  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 Her husband Victoria [name] is a Games developer. Her children, from a prior marriage, are Victoria [name] who lives in [place] and is an Chief Financial Officer, Victoria [name], who is a Consulting civil engineer in the [employer] literature PhD program at [employer] (and whose husband is from [place]), and Victoria [name], who works as a 0 keep her in [place].    ADVANCED DIRECTIVES: The patient tells me she has named her daughter Victoria [name] as her healthcare power of attorney. Victoria [name] can be contacted at [phone].   HEALTH MAINTENANCE: Social History  Substance Use Topics  . Smoking status: Never Smoker  . Smokeless tobacco: Never Used  . Alcohol use Yes     Comment: occ     Colonoscopy: 2016/[facility]     PAP:  Bone density: 2013   Allergies  Allergen Reactions  . Darvon [Propoxyphene Hcl] Other (See Comments)    hyperactivity  . Penicillins Itching    Has patient had a PCN reaction causing immediate rash, facial/tongue/throat swelling, SOB or lightheadedness with hypotension: No Has patient had a PCN reaction causing severe rash involving mucus membranes or skin necrosis: No Has patient had a PCN reaction that required hospitalization: No Has patient had a PCN reaction occurring within the last 10 years: No If all of the above answers are "NO", then may proceed with Cephalosporin use.     Current Outpatient Prescriptions  Medication Sig Dispense Refill  . acetaminophen (TYLENOL) 500 MG tablet Take 1,000 mg by mouth every 6 (six) hours as needed for mild pain or headache.    . calcium-vitamin D (OSCAL WITH D) 250-125 MG-UNIT tablet Take 1 tablet by mouth daily.    . cholecalciferol (VITAMIN D) 1000 UNITS tablet Take 1,000 Units by mouth daily.     . clonazePAM (KLONOPIN) 0.5 MG tablet Take 0.5-1 mg by mouth at  bedtime as needed (sleep).     . gabapentin (NEURONTIN) 300 MG capsule Take 1 capsule (300 mg total) by mouth at bedtime. 90 capsule 4  . glucosamine-chondroitin 500-400 MG tablet Take 1 tablet by mouth 2 (two) times daily.     . hyaluronate sodium (RADIAPLEXRX) GEL Apply 1 application topically 2 (two) times daily.    . multivitamin-iron-minerals-folic acid (CENTRUM) chewable tablet Chew 1 tablet by mouth daily.    . non-metallic deodorant Jethro Poling) MISC Apply 1 application topically daily as needed.    Marland Kitchen oxyCODONE-acetaminophen (PERCOCET) 10-325 MG tablet Take 1 tablet by mouth every 6 (six) hours as needed for pain. 10 tablet 0  . sertraline (ZOLOFT) 100 MG tablet Take 150 mg by mouth daily.    . vitamin E 400 UNIT capsule Take 400 Units by mouth daily.     No current facility-administered medications for this visit.     OBJECTIVE: Middle-aged white woman In no acute distress  Vitals:   10/16/16 1540  BP: 114/73  Pulse: 82  Resp: 18  Temp: 98.3 F (36.8 C)     Body mass index is 25.99 kg/m.    ECOG FS:1 - Symptomatic but completely ambulatory  Sclerae unicteric, pupils round and equal Oropharynx clear and moist No cervical or supraclavicular adenopathy Lungs no rales or rhonchi Heart regular rate and rhythm Abd soft, nontender, positive bowel sounds MSK no focal spinal tenderness, no upper extremity lymphedema Neuro: nonfocal, well oriented, appropriate affect Breasts: The right breast is status post lumpectomy and is currently receiving radiation. There is mild swelling or erythema. The cosmetic result is excellent. The left breast is unremarkable. Both axillae are benign.  LAB RESULTS:  CMP     Component Value Date/Time   NA 139 09/11/2016 0651   NA 143 08/22/2016 0831   K 4.0 09/11/2016 0651   K 4.3 08/22/2016 0831   CL 107 09/11/2016 0651   CO2 21 (L) 09/11/2016 0651   CO2 26 08/22/2016 0831   GLUCOSE 100 (H) 09/11/2016 0651   GLUCOSE 87 08/22/2016 0831   BUN 11  09/11/2016 0651   BUN 8.7 08/22/2016 0831   CREATININE 0.65 09/11/2016 0651   CREATININE 0.9 08/22/2016 0831   CALCIUM 8.7 (L) 09/11/2016 0651   CALCIUM 9.3 08/22/2016 0831   PROT 7.1 08/22/2016 0831   ALBUMIN 4.3 08/22/2016 0831   AST 29 08/22/2016 0831   ALT 29 08/22/2016 0831   ALKPHOS 85 08/22/2016 0831   BILITOT 0.35 08/22/2016 0831   GFRNONAA >60 09/11/2016 0651   GFRAA >60 09/11/2016 0651    No results found for: Ronnald Ramp, A1GS, A2GS, BETS, BETA2SER, GAMS, MSPIKE, SPEI  No results found for: Nils Pyle, [facility]  Lab Results  Component Value Date   WBC 7.0 09/11/2016   NEUTROABS 2.5 08/22/2016   HGB 12.2 09/11/2016   HCT 38.2 09/11/2016   MCV 91.2 09/11/2016   PLT 178 09/11/2016      Chemistry      Component Value Date/Time   NA 139 09/11/2016 0651   NA 143 08/22/2016 0831   K 4.0 09/11/2016 0651   K 4.3 08/22/2016 0831   CL 107 09/11/2016 0651   CO2 21 (L) 09/11/2016 0651   CO2 26 08/22/2016 0831   BUN 11 09/11/2016 0651   BUN 8.7 08/22/2016 0831   CREATININE 0.65 09/11/2016 0651   CREATININE 0.9 08/22/2016 0831      Component Value Date/Time   CALCIUM 8.7 (L) 09/11/2016 0651   CALCIUM 9.3 08/22/2016 0831   ALKPHOS 85 08/22/2016 0831   AST 29 08/22/2016 0831   ALT 29 08/22/2016 0831   BILITOT 0.35 08/22/2016 0831       No results found for: LABCA2  No components found for: [id number]  No results for input(s): INR in the last 168 hours.  Urinalysis No results found for: COLORURINE, APPEARANCEUR, LABSPEC, PHURINE, GLUCOSEU, HGBUR, BILIRUBINUR, KETONESUR, PROTEINUR, UROBILINOGEN, NITRITE, LEUKOCYTESUR   STUDIES: No results found.   ELIGIBLE FOR AVAILABLE RESEARCH PROTOCOL: No  ASSESSMENT: 66 y.o. [place] woman status post right breast lower inner quadrant biopsy 08/13/2016 for a clinical T1a N0, stage IA invasive ductal carcinoma, grade 1, estrogen and progesterone receptor positive, HER-2 not  amplified, with an MIB-1 of 2%  (1) breast Status post right lumpectomy and sentinel lymph node sampling 09/03/2016 for a pT1b pN0, stage IA invasive ductal carcinoma, grade 1, with a broadly positive anterior margin   (a) margins cleared with additional surgery 09/11/2016  (2) adjuvant radiation in progress, to be completed 11/07/2016   (3) consider anti-estrogens at the completion of local treatment  PLAN: Marybeth will complete her local treatment mid-July and should be ready to start anti-estrogens early August.  We discussed the difference between tamoxifen and anastrozole and this information was given to her in writing. She understands that anastrozole and the aromatase inhibitors in general work by blocking estrogen production. Accordingly vaginal dryness, decrease in bone density, and of course hot flashes can result. The aromatase inhibitors can also negatively affect the cholesterol profile, although that is a minor effect. One out of 5 women on aromatase inhibitors we will feel "old and achy". This arthralgia/myalgia syndrome, which resembles fibromyalgia clinically, does resolve with stopping the medications. Accordingly this is not a reason to not try an aromatase inhibitor but it is a frequent reason to stop it (in other words 20% of women will not be able to tolerate these medications).  Tamoxifen on the other hand does not block estrogen production. It does not "take away a woman's estrogen". It blocks the estrogen receptor in breast cells. Like anastrozole, it can also cause hot flashes. As opposed to anastrozole, tamoxifen has many estrogen-like effects. It is technically an estrogen receptor modulator. This means that in some tissues tamoxifen works like estrogen-- for example it helps strengthen the bones. It tends to improve the cholesterol profile. It can cause thickening of the endometrial lining, and even endometrial polyps or rarely cancer of the uterus.(The risk of uterine cancer  due to tamoxifen is one additional cancer per thousand women year). It can cause vaginal wetness or stickiness. It can cause blood clots through this estrogen-like effect--the risk of blood clots with tamoxifen is exactly the same as with birth control pills or hormone replacement.  Neither of these agents causes mood changes or weight gain, despite the popular belief that they can have these side effects. We have data from studies comparing either of these drugs with placebo, and in those cases the control group had the same amount of weight gain and depression as the group that took the drug.  In her case what is bothering her the most is going off estrogen with insomnia and hot flashes affecting her quality of life. I don't think she would be able to tolerate an aromatase inhibitor in this setting. Tamoxifen has the advantage that she may be able to use vaginal estrogens and perhaps also low dose progesterone such as Megace for hot flash control.  For the time being she is going to double up on the gabapentin at bedtime to 600 mg daily. She will start tamoxifen in August and she will return to see me in September. She knows to call for any problems that may develop before that visit.     Jordan Likes, MD   10/16/2016 3:43 PM Medical Oncology and Hematology [facility] [address] [place], [place] [address] Tel. [phone]    Fax. [phone]

## 2016-10-16 NOTE — Patient Instructions (Signed)
First of all, check with your insurance company to see if provider is in network   [facility]                                            [address]  [place], [place] [address] [phone]    Does not file for insurance--- call for appointment with Cathy  [facility]   (for wigs and compression sleeves / gloves/gauntlets )  [address] [place], [place] [address] [phone]  Will file some insurances --- call for appointment   [facility] (for mastectomy prosthetics and garments) [address] [place], [place] [address] [phone] Will file some insurances --- call for appointment  [facility]  [address]  [place], [place] [address] [phone] Lower extremity garments  [facility] [address] [place], [place]  [address] [phone]  [facility] Sales rep:  Matt Lawson:  [phone] [email or web address] Biocompression pumps   [facility]  Sales rep: Robert Rollins:  [phone] [email or web address] Entre and Flexitouch pumps    Other Resources: National Lymphedema Network:  www.lymphnet.org www.[email or web address].com for patient articles and purchase a self manual lymph drainage DVD [email or web address] has informative articles.  

## 2016-10-17 ENCOUNTER — Ambulatory Visit
Admission: RE | Admit: 2016-10-17 | Discharge: 2016-10-17 | Disposition: A | Discharge status: Home / Self Care | Payer: BC Managed Care – PPO | Source: Ambulatory Visit | Attending: Radiation Oncology | Admitting: Radiation Oncology

## 2016-10-17 DIAGNOSIS — Z510 Encounter for antineoplastic radiation therapy: Secondary | ICD-10-CM | POA: Diagnosis not present

## 2016-10-18 ENCOUNTER — Ambulatory Visit
Admission: RE | Admit: 2016-10-18 | Discharge: 2016-10-18 | Disposition: A | Discharge status: Home / Self Care | Payer: BC Managed Care – PPO | Source: Ambulatory Visit | Attending: Radiation Oncology | Admitting: Radiation Oncology

## 2016-10-18 DIAGNOSIS — Z510 Encounter for antineoplastic radiation therapy: Secondary | ICD-10-CM | POA: Diagnosis not present

## 2016-10-19 ENCOUNTER — Ambulatory Visit
Admission: RE | Admit: 2016-10-19 | Discharge: 2016-10-19 | Disposition: A | Discharge status: Home / Self Care | Payer: BC Managed Care – PPO | Source: Ambulatory Visit | Attending: Radiation Oncology | Admitting: Radiation Oncology

## 2016-10-19 DIAGNOSIS — Z510 Encounter for antineoplastic radiation therapy: Secondary | ICD-10-CM | POA: Diagnosis not present

## 2016-10-22 ENCOUNTER — Ambulatory Visit
Admission: RE | Admit: 2016-10-22 | Discharge: 2016-10-22 | Disposition: A | Discharge status: Home / Self Care | Payer: BC Managed Care – PPO | Source: Ambulatory Visit | Attending: Radiation Oncology | Admitting: Radiation Oncology

## 2016-10-22 DIAGNOSIS — Z510 Encounter for antineoplastic radiation therapy: Secondary | ICD-10-CM | POA: Diagnosis not present

## 2016-10-23 ENCOUNTER — Ambulatory Visit
Admission: RE | Admit: 2016-10-23 | Discharge: 2016-10-23 | Disposition: A | Discharge status: Home / Self Care | Payer: BC Managed Care – PPO | Source: Ambulatory Visit | Attending: Radiation Oncology | Admitting: Radiation Oncology

## 2016-10-23 DIAGNOSIS — Z510 Encounter for antineoplastic radiation therapy: Secondary | ICD-10-CM | POA: Diagnosis not present

## 2016-10-25 ENCOUNTER — Ambulatory Visit
Admission: RE | Admit: 2016-10-25 | Discharge: 2016-10-25 | Disposition: A | Discharge status: Home / Self Care | Payer: BC Managed Care – PPO | Source: Ambulatory Visit | Attending: Radiation Oncology | Admitting: Radiation Oncology

## 2016-10-25 DIAGNOSIS — Z510 Encounter for antineoplastic radiation therapy: Secondary | ICD-10-CM | POA: Diagnosis not present

## 2016-10-26 ENCOUNTER — Ambulatory Visit
Admission: RE | Admit: 2016-10-26 | Discharge: 2016-10-26 | Disposition: A | Discharge status: Home / Self Care | Payer: BC Managed Care – PPO | Source: Ambulatory Visit | Attending: Radiation Oncology | Admitting: Radiation Oncology

## 2016-10-26 DIAGNOSIS — Z510 Encounter for antineoplastic radiation therapy: Secondary | ICD-10-CM | POA: Diagnosis not present

## 2016-10-29 ENCOUNTER — Ambulatory Visit
Admission: RE | Admit: 2016-10-29 | Discharge: 2016-10-29 | Disposition: A | Discharge status: Home / Self Care | Payer: BC Managed Care – PPO | Source: Ambulatory Visit | Attending: Radiation Oncology | Admitting: Radiation Oncology

## 2016-10-29 DIAGNOSIS — Z510 Encounter for antineoplastic radiation therapy: Secondary | ICD-10-CM | POA: Diagnosis not present

## 2016-10-30 ENCOUNTER — Ambulatory Visit
Admission: RE | Admit: 2016-10-30 | Discharge: 2016-10-30 | Disposition: A | Discharge status: Home / Self Care | Payer: BC Managed Care – PPO | Source: Ambulatory Visit | Attending: Radiation Oncology | Admitting: Radiation Oncology

## 2016-10-30 ENCOUNTER — Ambulatory Visit
Admission: RE | Admit: 2016-10-30 | Payer: BC Managed Care – PPO | Source: Ambulatory Visit | Admitting: Radiation Oncology

## 2016-10-30 DIAGNOSIS — Z510 Encounter for antineoplastic radiation therapy: Secondary | ICD-10-CM | POA: Diagnosis not present

## 2016-10-31 ENCOUNTER — Ambulatory Visit: Payer: BC Managed Care – PPO | Admitting: Radiation Oncology

## 2016-10-31 ENCOUNTER — Ambulatory Visit
Admission: RE | Admit: 2016-10-31 | Discharge: 2016-10-31 | Disposition: A | Discharge status: Home / Self Care | Payer: BC Managed Care – PPO | Source: Ambulatory Visit | Attending: Radiation Oncology | Admitting: Radiation Oncology

## 2016-10-31 DIAGNOSIS — Z510 Encounter for antineoplastic radiation therapy: Secondary | ICD-10-CM | POA: Diagnosis not present

## 2016-11-01 ENCOUNTER — Ambulatory Visit
Admission: RE | Admit: 2016-11-01 | Discharge: 2016-11-01 | Disposition: A | Discharge status: Home / Self Care | Payer: BC Managed Care – PPO | Source: Ambulatory Visit | Attending: Radiation Oncology | Admitting: Radiation Oncology

## 2016-11-01 DIAGNOSIS — Z510 Encounter for antineoplastic radiation therapy: Secondary | ICD-10-CM | POA: Diagnosis not present

## 2016-11-02 ENCOUNTER — Ambulatory Visit
Admission: RE | Admit: 2016-11-02 | Discharge: 2016-11-02 | Disposition: A | Discharge status: Home / Self Care | Payer: BC Managed Care – PPO | Source: Ambulatory Visit | Attending: Radiation Oncology | Admitting: Radiation Oncology

## 2016-11-02 DIAGNOSIS — Z170 Estrogen receptor positive status [ER+]: Principal | ICD-10-CM

## 2016-11-02 DIAGNOSIS — Z510 Encounter for antineoplastic radiation therapy: Secondary | ICD-10-CM | POA: Diagnosis not present

## 2016-11-02 DIAGNOSIS — C50311 Malignant neoplasm of lower-inner quadrant of right female breast: Secondary | ICD-10-CM

## 2016-11-02 MED ORDER — RADIAPLEXRX EX GEL
Freq: Once | CUTANEOUS | Status: AC
  Administered 2016-11-02: 17:00:00 via TOPICAL

## 2016-11-05 ENCOUNTER — Ambulatory Visit
Admission: RE | Admit: 2016-11-05 | Discharge: 2016-11-05 | Disposition: A | Discharge status: Home / Self Care | Payer: BC Managed Care – PPO | Source: Ambulatory Visit | Attending: Radiation Oncology | Admitting: Radiation Oncology

## 2016-11-05 ENCOUNTER — Ambulatory Visit
Admission: RE | Admit: 2016-11-05 | Payer: BC Managed Care – PPO | Source: Ambulatory Visit | Admitting: Radiation Oncology

## 2016-11-05 DIAGNOSIS — Z510 Encounter for antineoplastic radiation therapy: Secondary | ICD-10-CM | POA: Diagnosis not present

## 2016-11-06 ENCOUNTER — Ambulatory Visit
Admission: RE | Admit: 2016-11-06 | Discharge: 2016-11-06 | Disposition: A | Discharge status: Home / Self Care | Payer: BC Managed Care – PPO | Source: Ambulatory Visit | Attending: Radiation Oncology | Admitting: Radiation Oncology

## 2016-11-06 DIAGNOSIS — Z510 Encounter for antineoplastic radiation therapy: Secondary | ICD-10-CM | POA: Diagnosis not present

## 2016-11-07 ENCOUNTER — Encounter: Payer: Self-pay | Admitting: Radiation Oncology

## 2016-11-07 ENCOUNTER — Ambulatory Visit
Admission: RE | Admit: 2016-11-07 | Discharge: 2016-11-07 | Disposition: A | Discharge status: Home / Self Care | Payer: BC Managed Care – PPO | Source: Ambulatory Visit | Attending: Radiation Oncology | Admitting: Radiation Oncology

## 2016-11-07 ENCOUNTER — Encounter: Payer: Self-pay | Admitting: *Deleted

## 2016-11-07 DIAGNOSIS — Z510 Encounter for antineoplastic radiation therapy: Secondary | ICD-10-CM | POA: Diagnosis not present

## 2016-11-14 NOTE — Progress Notes (Signed)
  Radiation Oncology         ([phone] ________________________________  Name: Victoria [name] MRN: [id number]  Date: 11/07/2016  DOB: 19-Nov-1950  End of Treatment Note  Diagnosis:   Stage IA, T1a, N0, MX ER/PR positive grade 1 invasive ductal carcinoma with DCIS of the right breast.     Indication for treatment:  Curative       Radiation treatment dates:   10/10/2016 to 11/07/2016  Site/dose:    1. The Right breast was treated to 42.5 Gy in 17 fractions at 2.5 Gy per fraction. 2. The Right breast was boosted to 7.5 Gy in 3 fractions at 2.5 Gy per fraction.  Beams/energy:    1. 3D // 6X 2. En face electron // 15 MeV  Narrative: The patient tolerated radiation treatment relatively well.   She developed mild radiation effect/dermatitis in the treatment area. No significant desquamation. Denies pain or fatigue. Using radiaplex bid.   Plan: The patient has completed radiation treatment. The patient will return to radiation oncology clinic for routine followup in one month. I advised them to call or return sooner if they have any questions or concerns related to their recovery or treatment.  ------------------------------------------------  Jodelle Gross, MD, PhD  This document serves as a record of services personally performed by Kyung Rudd, MD. It was created on his behalf by Arlyce Harman, a trained medical scribe. The creation of this record is based on the scribe's personal observations and the provider's statements to them. This document has been checked and approved by the attending provider.

## 2016-12-24 ENCOUNTER — Ambulatory Visit: Payer: BC Managed Care – PPO | Admitting: Oncology

## 2016-12-25 ENCOUNTER — Ambulatory Visit (HOSPITAL_BASED_OUTPATIENT_CLINIC_OR_DEPARTMENT_OTHER): Payer: BC Managed Care – PPO | Admitting: Oncology

## 2016-12-25 VITALS — BP 102/70 | HR 90 | Temp 98.9°F | Resp 18 | Ht 64.0 in | Wt 152.1 lb

## 2016-12-25 DIAGNOSIS — Z79811 Long term (current) use of aromatase inhibitors: Secondary | ICD-10-CM | POA: Diagnosis not present

## 2016-12-25 DIAGNOSIS — N951 Menopausal and female climacteric states: Secondary | ICD-10-CM

## 2016-12-25 DIAGNOSIS — C50311 Malignant neoplasm of lower-inner quadrant of right female breast: Secondary | ICD-10-CM | POA: Diagnosis not present

## 2016-12-25 DIAGNOSIS — Z170 Estrogen receptor positive status [ER+]: Secondary | ICD-10-CM | POA: Diagnosis not present

## 2016-12-25 MED ORDER — CLONAZEPAM 0.5 MG PO TABS: 0.5000 mg | ORAL_TABLET | Freq: Every evening | ORAL | 3 refills | Status: DC | PRN

## 2016-12-25 MED ORDER — TAMOXIFEN CITRATE 20 MG PO TABS: 20.0000 mg | ORAL_TABLET | Freq: Every day | ORAL | 12 refills | Status: AC

## 2016-12-25 MED ORDER — GABAPENTIN 300 MG PO CAPS: 600.0000 mg | ORAL_CAPSULE | Freq: Every day | ORAL | 4 refills | Status: DC

## 2016-12-25 NOTE — Progress Notes (Signed)
[place]  Telephone:([phone]) [phone] Fax:(336) [phone]     ID: Tayna Smethurst DOB: [birth year]  MR#: [id number]  HEN#:[id number]  Patient Care Team: Cari Caraway, MD as PCP - General (Family Medicine) Kaylana Fenstermacher, Virgie Dad, MD as Consulting Physician (Oncology) Rolm Bookbinder, MD as Consulting Physician (General Surgery) Kyung Rudd, MD as Consulting Physician (Radiation Oncology) Christene Slates, MD as Physician Assistant (Radiology) Chauncey Cruel, MD OTHER MD:  CHIEF COMPLAINT: Estrogen receptor positive breast cancer  CURRENT TREATMENT: Tamoxifen   BREAST CANCER HISTORY: From the original intake note:  Nathaniel had routine bilateral screening mammography [facility] 07[facility]20/2016. The breast density was category B. In the right breast lower inner quadrant there was an oval mass associated with architectural distortion. On 08/02/2016 the patient was brought back for further imaging. Right diagnostic mammography confirmed a 0.5 cm all nodule in the right breast at the 4:00 radiant. By ultrasonography this measured 0.4 cm and had lobulated margins.  Biopsy of the right breast mass in question 08/13/2016 showed ([id number]) invasive ductal carcinoma, grade 1, estrogen receptor 100% positive, progesterone receptor 100% positive, both with strong staining intensity, with an MIB-1 of 2%, and no HER-2 notification, the signals ratio being 1.27 and the number per cell 2.10.  The patient's subsequent history is as detailed below  INTERVAL HISTORY: Zemirah returns today for follow-up and treatment of her estrogen receptor positive breast cancer. Since last visit here she completed her radiation treatments, in mid July. She generally did well with these. She did have some disclamation with a last 3 treatments and then for the next 04 weeks was very tired. This is now resolving.  She is now ready to consider anti-estrogens  REVIEW OF SYSTEMS: Her biggest problem is insomnia. She  has tried everything and nothing works. This is very frustrating to her. She is able to exercise about 3 times a week. A detailed review of systems today was otherwise stable  PAST MEDICAL HISTORY: Past Medical History:  Diagnosis Date  . Arthritis    neck  . Cancer ([place]) 07/2016   right breast cancer  . Depression   . Family history of adverse reaction to anesthesia    mother had cardiac arrest and PONV  . Headache    migraine  . History of kidney stones   . No pertinent past medical history     PAST SURGICAL HISTORY: Past Surgical History:  Procedure Laterality Date  . BACK SURGERY     diskectomy L5-6  . BREAST LUMPECTOMY WITH RADIOACTIVE SEED AND SENTINEL LYMPH NODE BIOPSY Right 09/03/2016   Procedure: RIGHT BREAST LUMPECTOMY WITH RADIOACTIVE SEED AND RIGHT AXILLARY SENTINEL NODE BIOPSY;  Surgeon: Rolm Bookbinder, MD;  Location: [facility];  Service: General;  Laterality: Right;  . CESAREAN SECTION     triplets  . DILATION AND CURETTAGE OF UTERUS    . KNEE ARTHROSCOPY Right   . RE-EXCISION OF BREAST LUMPECTOMY Right 09/11/2016   Procedure: RIGHT RE-EXCISION OF BREAST LUMPECTOMY;  Surgeon: Rolm Bookbinder, MD;  Location: [facility];  Service: General;  Laterality: Right;    FAMILY HISTORY Family History  Problem Relation Age of Onset  . Breast cancer Paternal Aunt   . Breast cancer Cousin   The patient's father died from complications of emphysema at age 87. The patient's mother died following a stroke at age 53. The patient had no brothers, 1 sister. A paternal aunt had breast cancer in her 22s and a paternal second cousin breast cancer in her  60s. There is no history of ovarian cancer in the family  GYNECOLOGIC HISTORY:  No LMP recorded. Patient is postmenopausal. Menarche age 2, first live birth age 89. The patient is GX P3. She stopped having periods in 2004. She took hormone replacement until 2018, when she was diagnosed with breast cancer.  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 Her husband [name] is a Games developer. Her children, from a prior marriage, are [name] who lives in [place] and is an Chief Financial Officer, [name], who is a Consulting civil engineer in the [place] literature PhD program at [employer] (and whose husband is from [place]), and [place], who works as a 0 keep her in [place].    ADVANCED DIRECTIVES: The patient tells me she has named her daughter [name] as her healthcare power of attorney. [name] can be contacted at [phone].   HEALTH MAINTENANCE: Social History  Substance Use Topics  . Smoking status: Never Smoker  . Smokeless tobacco: Never Used  . Alcohol use Yes     Comment: occ     Colonoscopy: 2016/[facility]     PAP:  Bone density: 2013   Allergies  Allergen Reactions  . Darvon [Propoxyphene Hcl] Other (See Comments)    hyperactivity  . Penicillins Itching    Has patient had a PCN reaction causing immediate rash, facial/tongue/throat swelling, SOB or lightheadedness with hypotension: No Has patient had a PCN reaction causing severe rash involving mucus membranes or skin necrosis: No Has patient had a PCN reaction that required hospitalization: No Has patient had a PCN reaction occurring within the last 10 years: No If all of the above answers are "NO", then may proceed with Cephalosporin use.     Current Outpatient Prescriptions  Medication Sig Dispense Refill  . acetaminophen (TYLENOL) 500 MG tablet Take 1,000 mg by mouth every 6 (six) hours as needed for mild pain or headache.    . calcium-vitamin D (OSCAL WITH D) 250-125 MG-UNIT tablet Take 1 tablet by mouth daily.    . cholecalciferol (VITAMIN D) 1000 UNITS tablet Take 1,000 Units by mouth daily.     . clonazePAM (KLONOPIN) 0.5 MG tablet Take 1-2 tablets (0.5-1 mg total) by mouth at bedtime as needed (sleep). 60 tablet 3  . gabapentin (NEURONTIN) 300 MG capsule Take  2 capsules (600 mg total) by mouth at bedtime. 90 capsule 4  . glucosamine-chondroitin 500-400 MG tablet Take 1 tablet by mouth 2 (two) times daily.     . hyaluronate sodium (RADIAPLEXRX) GEL Apply 1 application topically 2 (two) times daily.    . multivitamin-iron-minerals-folic acid (CENTRUM) chewable tablet Chew 1 tablet by mouth daily.    . sertraline (ZOLOFT) 100 MG tablet Take 150 mg by mouth daily.    . vitamin E 400 UNIT capsule Take 400 Units by mouth daily.    . tamoxifen (NOLVADEX) 20 MG tablet Take 1 tablet (20 mg total) by mouth daily. 90 tablet 12   No current facility-administered medications for this visit.     OBJECTIVE: Middle-aged white woman Who appears stated age  89:   12/25/16 1101  BP: 102/70  Pulse: 90  Resp: 18  Temp: 98.9 F (37.2 C)  SpO2: 97%     Body mass index is 26.11 kg/m.    ECOG FS:1 - Symptomatic but completely ambulatory  Sclerae unicteric, EOMs intact Oropharynx clear and moist No cervical or supraclavicular adenopathy Lungs no rales or rhonchi  Heart regular rate and rhythm Abd soft, nontender, positive bowel sounds MSK no focal spinal tenderness, no upper extremity lymphedema Neuro: nonfocal, well oriented, appropriate affect Breasts: The right breast is status post lumpectomy and radiation. There is still some hyperpigmentation but it is resolving. There is no evidence of local recurrence and the cosmetic result is good. The left breast is benign. Both axillae are benign.  LAB RESULTS:  CMP     Component Value Date/Time   NA 139 09/11/2016 0651   NA 143 08/22/2016 0831   K 4.0 09/11/2016 0651   K 4.3 08/22/2016 0831   CL 107 09/11/2016 0651   CO2 21 (L) 09/11/2016 0651   CO2 26 08/22/2016 0831   GLUCOSE 100 (H) 09/11/2016 0651   GLUCOSE 87 08/22/2016 0831   BUN 11 09/11/2016 0651   BUN 8.7 08/22/2016 0831   CREATININE 0.65 09/11/2016 0651   CREATININE 0.9 08/22/2016 0831   CALCIUM 8.7 (L) 09/11/2016 0651   CALCIUM 9.3  08/22/2016 0831   PROT 7.1 08/22/2016 0831   ALBUMIN 4.3 08/22/2016 0831   AST 29 08/22/2016 0831   ALT 29 08/22/2016 0831   ALKPHOS 85 08/22/2016 0831   BILITOT 0.35 08/22/2016 0831   GFRNONAA >60 09/11/2016 0651   GFRAA >60 09/11/2016 0651    No results found for: Ronnald Ramp, A1GS, A2GS, BETS, BETA2SER, GAMS, MSPIKE, SPEI  No results found for: Nils Pyle, [facility]  Lab Results  Component Value Date   WBC 7.0 09/11/2016   NEUTROABS 2.5 08/22/2016   HGB 12.2 09/11/2016   HCT 38.2 09/11/2016   MCV 91.2 09/11/2016   PLT 178 09/11/2016      Chemistry      Component Value Date/Time   NA 139 09/11/2016 0651   NA 143 08/22/2016 0831   K 4.0 09/11/2016 0651   K 4.3 08/22/2016 0831   CL 107 09/11/2016 0651   CO2 21 (L) 09/11/2016 0651   CO2 26 08/22/2016 0831   BUN 11 09/11/2016 0651   BUN 8.7 08/22/2016 0831   CREATININE 0.65 09/11/2016 0651   CREATININE 0.9 08/22/2016 0831      Component Value Date/Time   CALCIUM 8.7 (L) 09/11/2016 0651   CALCIUM 9.3 08/22/2016 0831   ALKPHOS 85 08/22/2016 0831   AST 29 08/22/2016 0831   ALT 29 08/22/2016 0831   BILITOT 0.35 08/22/2016 0831       No results found for: LABCA2  No components found for: [id number]  No results for input(s): INR in the last 168 hours.  Urinalysis No results found for: COLORURINE, APPEARANCEUR, LABSPEC, PHURINE, GLUCOSEU, HGBUR, BILIRUBINUR, KETONESUR, PROTEINUR, UROBILINOGEN, NITRITE, LEUKOCYTESUR   STUDIES: No results found.   ELIGIBLE FOR AVAILABLE RESEARCH PROTOCOL: No  ASSESSMENT: 66 y.o. [place] woman status post right breast lower inner quadrant biopsy 08/13/2016 for a clinical T1a N0, stage IA invasive ductal carcinoma, grade 1, estrogen and progesterone receptor positive, HER-2 not amplified, with an MIB-1 of 2%  (1) breast Status post right lumpectomy and sentinel lymph node sampling 09/03/2016 for a pT1b pN0, stage IA invasive ductal carcinoma,  grade 1, with a broadly positive anterior margin   (a) margins cleared with additional surgery 09/11/2016  (2) adjuvant radiation6/20/2018 to 11/07/2016 Site/dose:    1. The Right breast was treated to 42.5 Gy in 17 fractions at 2.5 Gy per fraction. 2. The Right breast was boosted to 7.5 Gy in 3 fractions at 2.5 Gy per fraction.  (3) Tamoxifen started 12/25/2016  PLAN:  Alegandra has completed her local treatment for breast cancer and now is ready to start anti-estrogens. We reviewed the possible toxicities, side effects and complications of tamoxifen. She is already having significant hot flashes and insomnia. Clearly these are not going to get better but probably will also not get any worse with tamoxifen.  She is very bothered by the insomnia problem. She is already getting tolerance from the Klonopin. I am glad to refill it for her but she understands this is not going to solve her problem. She will simply need a higher and higher doses to accomplish the same lack of effectiveness.  I suggested she go off the [place] for 2 weeks which will be sensitizer and then she can go back to the 0.5 mg. She is going to consider that.  I am scheduling her to meet with our survivorship nurse practitioner in December. She will review at that time how she is tolerating the tamoxifen. If there are problems with that we can go to anastrozole, but I suspect she would not be able to tolerate that well because of concerns regarding arthralgias and myalgias.  She will then meet with Dr. Donne Hazel sometime likely in March. She will see me again in May. Of course we will be available for any other problems that may develop before then.     Chauncey Cruel, MD   12/25/2016 11:19 AM Medical Oncology and Hematology [facility] [address] [place], [place] [address] Tel. [phone]    Fax. [phone]

## 2016-12-31 ENCOUNTER — Ambulatory Visit: Payer: BC Managed Care – PPO | Admitting: Radiation Oncology

## 2017-01-02 ENCOUNTER — Encounter: Payer: Self-pay | Admitting: Radiation Oncology

## 2017-01-02 ENCOUNTER — Ambulatory Visit
Admission: RE | Admit: 2017-01-02 | Discharge: 2017-01-02 | Disposition: A | Discharge status: Home / Self Care | Payer: BC Managed Care – PPO | Source: Ambulatory Visit | Attending: Radiation Oncology | Admitting: Radiation Oncology

## 2017-01-02 VITALS — BP 107/66 | HR 77 | Temp 98.2°F | Resp 18 | Ht 64.0 in | Wt 156.0 lb

## 2017-01-02 DIAGNOSIS — Z888 Allergy status to other drugs, medicaments and biological substances status: Secondary | ICD-10-CM | POA: Diagnosis not present

## 2017-01-02 DIAGNOSIS — C50911 Malignant neoplasm of unspecified site of right female breast: Secondary | ICD-10-CM | POA: Insufficient documentation

## 2017-01-02 DIAGNOSIS — Z79810 Long term (current) use of selective estrogen receptor modulators (SERMs): Secondary | ICD-10-CM | POA: Diagnosis not present

## 2017-01-02 DIAGNOSIS — Z170 Estrogen receptor positive status [ER+]: Secondary | ICD-10-CM | POA: Insufficient documentation

## 2017-01-02 DIAGNOSIS — C50311 Malignant neoplasm of lower-inner quadrant of right female breast: Secondary | ICD-10-CM

## 2017-01-02 DIAGNOSIS — Z880 Allergy status to penicillin: Secondary | ICD-10-CM | POA: Insufficient documentation

## 2017-01-02 DIAGNOSIS — Z923 Personal history of irradiation: Secondary | ICD-10-CM | POA: Diagnosis not present

## 2017-01-02 NOTE — Addendum Note (Signed)
Encounter addended by: Malena Edman, RN on: 01/02/2017  3:36 PM<BR>    Actions taken: Charge Capture section accepted

## 2017-01-02 NOTE — Progress Notes (Signed)
Radiation Oncology         [phone] ________________________________  Name: Yesennia Hirota MRN: [id number]  Date of Service: 01/02/2017  DOB: 24-Mar-1951  Post Treatment Note  CC: Cari Caraway, MD  Rolm Bookbinder, MD  Diagnosis:   Stage IA, T1a, N0, MX ER/PR positive grade 1 invasive ductal carcinoma with DCIS of the right breast.    Interval Since Last Radiation:  11 weeks   10/10/2016 to 11/07/2016: 1. The Right breast was treated to 42.5 Gy in 17 fractions at 2.5 Gy per fraction. 2. The Right breast was boosted to 7.5 Gy in 3 fractions at 2.5 Gy per fraction.  Narrative:  The patient returns today for routine follow-up. During treatment she did very well with radiotherapy and did not have significant desquamation.                             On review of systems, the patient states she's doing well overall. She denies any concerns of her skin at this time, and reports that she did have some irritation and breakdown of the skin and the last few days of her treatment however this has resolved without any issues. She denies any concerns with tamoxifen and seems to be tolerating this well. No other complaints or verbalized  ALLERGIES:  is allergic to darvon [propoxyphene hcl] and penicillins.  Meds: Current Outpatient Prescriptions  Medication Sig Dispense Refill  . calcium-vitamin D (OSCAL WITH D) 250-125 MG-UNIT tablet Take 1 tablet by mouth daily.    . cholecalciferol (VITAMIN D) 1000 UNITS tablet Take 1,000 Units by mouth daily.     . clonazePAM (KLONOPIN) 0.5 MG tablet Take 1-2 tablets (0.5-1 mg total) by mouth at bedtime as needed (sleep). 60 tablet 3  . gabapentin (NEURONTIN) 300 MG capsule Take 2 capsules (600 mg total) by mouth at bedtime. 90 capsule 4  . glucosamine-chondroitin 500-400 MG tablet Take 1 tablet by mouth 2 (two) times daily.     Marland Kitchen ibuprofen (ADVIL,MOTRIN) 200 MG tablet Take 200 mg by mouth every 8 (eight) hours as needed.    .  multivitamin-iron-minerals-folic acid (CENTRUM) chewable tablet Chew 1 tablet by mouth daily.    . sertraline (ZOLOFT) 100 MG tablet Take 150 mg by mouth daily.    . tamoxifen (NOLVADEX) 20 MG tablet Take 1 tablet (20 mg total) by mouth daily. 90 tablet 12  . vitamin E 400 UNIT capsule Take 400 Units by mouth daily.    Marland Kitchen acetaminophen (TYLENOL) 500 MG tablet Take 1,000 mg by mouth every 6 (six) hours as needed for mild pain or headache.     No current facility-administered medications for this encounter.     Physical Findings:  height is 5\' 4"  (1.626 m) and weight is 156 lb (70.8 kg). Her oral temperature is 98.2 F (36.8 C). Her blood pressure is 107/66 and her pulse is 77. Her respiration is 18 and oxygen saturation is 99%.  Pain Assessment Pain Score: 0-No pain/10 In general this is a well appearing caucasian female in no acute distress. She's alert and oriented x4 and appropriate throughout the examination. Cardiopulmonary assessment is negative for acute distress and she exhibits normal effort. The right breast was examined and reveals mild hyperpigmentation without desqumation.   Lab Findings: Lab Results  Component Value Date   WBC 7.0 09/11/2016   HGB 12.2 09/11/2016   HCT 38.2 09/11/2016   MCV 91.2 09/11/2016   PLT 178  09/11/2016     Radiographic Findings: No results found.  Impression/Plan: 1. Stage IA, T1a, N0, MX ER/PR positive grade 1 invasive ductal carcinoma with DCIS of the right breast. The patient has been doing well since completion of radiotherapy. We discussed that we would be happy to continue to follow her as needed, but she will also continue to follow up with Dr. Jana Hakim in medical oncology. She was counseled on skin care as well as measures to avoid sun exposure to this area.  2. Survivorship. She was encouraged to pursue her visit in the survivorship clinic and I explained the role of this visit.     Carola Rhine, PAC

## 2017-03-20 ENCOUNTER — Telehealth: Payer: Self-pay

## 2017-03-20 NOTE — Telephone Encounter (Signed)
Left vm for pt to remind of SCP visit on 03/26/17 at 10 am. Encouraged pt to center with questions.

## 2017-03-26 ENCOUNTER — Ambulatory Visit (HOSPITAL_BASED_OUTPATIENT_CLINIC_OR_DEPARTMENT_OTHER): Payer: BC Managed Care – PPO | Admitting: Adult Health

## 2017-03-26 ENCOUNTER — Encounter: Payer: Self-pay | Admitting: Adult Health

## 2017-03-26 VITALS — BP 100/69 | HR 96 | Temp 98.6°F | Resp 20 | Ht 64.0 in | Wt 149.7 lb

## 2017-03-26 DIAGNOSIS — Z170 Estrogen receptor positive status [ER+]: Secondary | ICD-10-CM

## 2017-03-26 DIAGNOSIS — N951 Menopausal and female climacteric states: Secondary | ICD-10-CM | POA: Diagnosis not present

## 2017-03-26 DIAGNOSIS — C50311 Malignant neoplasm of lower-inner quadrant of right female breast: Secondary | ICD-10-CM | POA: Diagnosis not present

## 2017-03-26 NOTE — Progress Notes (Signed)
CLINIC:  Survivorship   REASON FOR VISIT:  Routine follow-up post-treatment for a recent history of breast cancer.  BRIEF ONCOLOGIC HISTORY:    Malignant neoplasm of lower-inner quadrant of right breast of female, estrogen receptor positive ([place])   08/13/2016 Initial Biopsy    Right breast lower inner quadrant biopsy, IDC, grade 1, ER/PR positive, HER-2 negative, Ki-67 2%.        08/20/2016 Initial Diagnosis    Malignant neoplasm of lower-inner quadrant of right breast of female, estrogen receptor positive ([place])      09/03/2016 Surgery    Right lumpectomy and SLNB: IDC, 0.6 cm, grade 1, 7 SLN negative, anterior margin positive.  T1b, N0      09/11/2016 Surgery    Re excision, positive margin cleared.      10/10/2016 - 11/07/2016 Radiation Therapy    1. The Right breast was treated to 42.5 Gy in 17 fractions at 2.5 Gy per fraction.  2. The Right breast was boosted to 7.5 Gy in 3 fractions at 2.5 Gy per fraction.        12/2016 -  Anti-estrogen oral therapy    Tamoxifen daily       INTERVAL HISTORY:  Victoria [name] presents to the [facility] today for our initial meeting to review her survivorship care plan detailing her treatment course for breast cancer, as well as monitoring long-term side effects of that treatment, education regarding health maintenance, screening, and overall wellness and health promotion.     Overall, Victoria [name] reports feeling quite well.  She is taking Tamoxifen daily, and other than 4-5 hot flashes per day she is tolerating it well.  She does not have any other issues.  She is walking regularly with her husband.      REVIEW OF SYSTEMS:  Review of Systems  Constitutional: Negative for appetite change, chills, fatigue, fever and unexpected weight change.  HENT:   Negative for hearing loss, lump/mass, nosebleeds, sore throat and trouble swallowing.   Eyes: Negative for eye problems and icterus.  Respiratory: Negative for chest tightness, cough and  shortness of breath.   Cardiovascular: Negative for chest pain, leg swelling and palpitations.  Gastrointestinal: Negative for abdominal distention, abdominal pain, constipation, diarrhea, nausea and vomiting.  Endocrine: Positive for hot flashes.  Musculoskeletal: Negative for arthralgias.  Skin: Negative for itching and rash.  Neurological: Negative for dizziness and headaches.  Hematological: Negative for adenopathy. Does not bruise/bleed easily.  Psychiatric/Behavioral: Negative for depression. The patient is not nervous/anxious.   Breast: Denies any new nodularity, masses, tenderness, nipple changes, or nipple discharge.      ONCOLOGY TREATMENT TEAM:  1. Surgeon:  Dr. Donne Hazel at [facility] 2. Medical Oncologist: Dr. Jana Hakim  3. Radiation Oncologist: Dr. Lisbeth Renshaw    PAST MEDICAL/SURGICAL HISTORY:  Past Medical History:  Diagnosis Date  . Arthritis    neck  . Cancer ([place]) 07/2016   right breast cancer  . Depression   . Family history of adverse reaction to anesthesia    mother had cardiac arrest and PONV  . Headache    migraine  . History of kidney stones   . No pertinent past medical history    Past Surgical History:  Procedure Laterality Date  . BACK SURGERY     diskectomy L5-6  . BREAST LUMPECTOMY WITH RADIOACTIVE SEED AND SENTINEL LYMPH NODE BIOPSY Right 09/03/2016   Procedure: RIGHT BREAST LUMPECTOMY WITH RADIOACTIVE SEED AND RIGHT AXILLARY SENTINEL NODE BIOPSY;  Surgeon: Rolm Bookbinder, MD;  Location: [facility];  Service: General;  Laterality: Right;  . CESAREAN SECTION     triplets  . DILATION AND CURETTAGE OF UTERUS    . KNEE ARTHROSCOPY Right   . RE-EXCISION OF BREAST LUMPECTOMY Right 09/11/2016   Procedure: RIGHT RE-EXCISION OF BREAST LUMPECTOMY;  Surgeon: Rolm Bookbinder, MD;  Location: [facility];  Service: General;  Laterality: Right;     ALLERGIES:  Allergies  Allergen Reactions  . Darvon [Propoxyphene Hcl] Other (See  Comments)    hyperactivity  . Penicillins Itching    Has patient had a PCN reaction causing immediate rash, facial/tongue/throat swelling, SOB or lightheadedness with hypotension: No Has patient had a PCN reaction causing severe rash involving mucus membranes or skin necrosis: No Has patient had a PCN reaction that required hospitalization: No Has patient had a PCN reaction occurring within the last 10 years: No If all of the above answers are "NO", then may proceed with Cephalosporin use.      CURRENT MEDICATIONS:  Outpatient Encounter Medications as of 03/26/2017  Medication Sig  . acetaminophen (TYLENOL) 500 MG tablet Take 1,000 mg by mouth every 6 (six) hours as needed for mild pain or headache.  . calcium-vitamin D (OSCAL WITH D) 250-125 MG-UNIT tablet Take 1 tablet by mouth daily.  . cholecalciferol (VITAMIN D) 1000 UNITS tablet Take 1,000 Units by mouth daily.   . clonazePAM (KLONOPIN) 0.5 MG tablet Take 1-2 tablets (0.5-1 mg total) by mouth at bedtime as needed (sleep).  . gabapentin (NEURONTIN) 300 MG capsule Take 2 capsules (600 mg total) by mouth at bedtime. (Patient taking differently: Take 300 mg by mouth at bedtime. )  . glucosamine-chondroitin 500-400 MG tablet Take 1 tablet by mouth 2 (two) times daily.   Marland Kitchen ibuprofen (ADVIL,MOTRIN) 200 MG tablet Take 200 mg by mouth every 8 (eight) hours as needed.  . multivitamin-iron-minerals-folic acid (CENTRUM) chewable tablet Chew 1 tablet by mouth daily.  . sertraline (ZOLOFT) 100 MG tablet Take 150 mg by mouth daily.  . tamoxifen (NOLVADEX) 20 MG tablet Take 20 mg by mouth daily.  . vitamin E 400 UNIT capsule Take 400 Units by mouth daily.   No facility-administered encounter medications on file as of 03/26/2017.      ONCOLOGIC FAMILY HISTORY:  Family History  Problem Relation Age of Onset  . Breast cancer Paternal Aunt   . Breast cancer Cousin      GENETIC COUNSELING/TESTING: Not at this time  SOCIAL HISTORY:  Victoria [name] is married and lives with her husband in [place], [place].  She has three children, triplets and they live in [place], [place], [place], [place].  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  She denies any current or history of tobacco, alcohol, or illicit drug use.     PHYSICAL EXAMINATION:  Vital Signs:   Vitals:   03/26/17 1000  BP: 100/69  Pulse: 96  Resp: 20  Temp: 98.6 F (37 C)  SpO2: 96%   Filed Weights   03/26/17 1000  Weight: 149 lb 11.2 oz (67.9 kg)   General: Well-nourished, well-appearing female in no acute distress.  She is unaccompanied today.   HEENT: Head is normocephalic.  Pupils equal and reactive to light. Conjunctivae clear without exudate.  Sclerae anicteric. Oral mucosa is pink, moist.  Oropharynx is pink without lesions or erythema.  Lymph: No cervical, supraclavicular, or infraclavicular lymphadenopathy noted on palpation.  Cardiovascular:  Regular rate and rhythm.Marland Kitchen Respiratory: Clear to auscultation bilaterally. Chest expansion symmetric; breathing non-labored.  Breasts: right breast s/p lumpectomy, mild amt of scar tissue present, no nodules, masses, or concern for recurrence, left breast without nodules, masses, skin or nipple changes.  GI: Abdomen soft and round; non-tender, non-distended. Bowel sounds normoactive.  GU: Deferred.  Neuro: No focal deficits. Steady gait.  Psych: Mood and affect normal and appropriate for situation.  Extremities: No edema. MSK: No focal spinal tenderness to palpation.  Full range of motion in bilateral upper extremities Skin: Warm and dry.  LABORATORY DATA:  None for this visit.  DIAGNOSTIC IMAGING:  None for this visit.      ASSESSMENT AND PLAN:  Victoria [name] is a pleasant 66 y.o. female with Stage IA right breast invasive ductal carcinoma, ER+/PR+/HER2-, diagnosed in 07/2016, treated with lumpectomy, adjuvant radiation therapy, and  anti-estrogen therapy with Tamoxifen beginning in 12/2016.  She presents to the Survivorship Clinic for our initial meeting and routine follow-up post-completion of treatment for breast cancer.    1. Stage IA right breast cancer:  Victoria [name] is continuing to recover from definitive treatment for breast cancer. She will follow-up with her medical oncologist, Dr. Jana Hakim in 08/2017 with history and physical exam per surveillance protocol.  She will continue her anti-estrogen therapy with Tamoxifen. Thus far, she is tolerating the Tamoxifen well, with minimal side effects. She was instructed to make Dr. Lindi Adie or myself aware if she begins to experience any worsening side effects of the medication and I could see her back in clinic to help manage those side effects, as needed. Today, a comprehensive survivorship care plan and treatment summary was reviewed with the patient today detailing her breast cancer diagnosis, treatment course, potential late/long-term effects of treatment, appropriate follow-up care with recommendations for the future, and patient education resources.  A copy of this summary, along with a letter will be sent to the patient's primary care provider via mail/fax/In Basket message after today's visit.    2. Hot flashes: I reviewed non pharmacologic interventions for these with the patient.  She received these interventions in her SCP.  She will try these.  There are medications she can take for hot flashes, but those come with their own set of side effects as well.  She will try these first.    3. Bone health:  Given Victoria [name]'s age/history of breast cancer, she is at risk for bone demineralization. I counseled her that Tamoxifen will have a protective effect on her bones,   She was given education on specific activities to promote bone health.  4. Cancer screening:  Due to Victoria [name]'s history and her age, she should receive screening for skin cancers, colon cancer, and gynecologic cancers.   The information and recommendations are listed on the patient's comprehensive care plan/treatment summary and were reviewed in detail with the patient.    5. Health maintenance and wellness promotion: Victoria [name] was encouraged to consume 5-7 servings of fruits and vegetables per day. We reviewed the "Nutrition Rainbow" handout, as well as the handout "Take Control of Your Health and Reduce Your Cancer Risk" from the [employer].  She was also encouraged to engage in moderate to vigorous exercise for 30 minutes per day most days of the week. We discussed the [employer] fitness program, which is designed for cancer survivors to help them become more physically fit after cancer treatments.  She was instructed to limit her alcohol consumption and continue to  abstain from tobacco use.     6. Support services/counseling: It is not uncommon for this period of the patient's cancer care trajectory to be one of many emotions and stressors.  We discussed an opportunity for her to participate in the next session of [facility] ("Finding Your New Normal") support group series designed for patients after they have completed treatment.   Victoria [name] was encouraged to take advantage of our many other support services programs, support groups, and/or counseling in coping with her new life as a cancer survivor after completing anti-cancer treatment.  She was offered support today through active listening and expressive supportive counseling.  She was given information regarding our available services and encouraged to contact me with any questions or for help enrolling in any of our support group/programs.    Dispo:   -Return to cancer center in 08/2017 for follow up with Dr. Jana Hakim -Mammogram due in 06/2017 -Follow up with Dr. Donne Hazel in 02/2018 -She is welcome to return back to the Survivorship Clinic at any time; no additional follow-up needed at this time.  -Consider referral back to survivorship as a  long-term survivor for continued surveillance  A total of (30) minutes of face-to-face time was spent with this patient with greater than 50% of that time in counseling and care-coordination.   Gardenia Phlegm, NP Survivorship Program [facility] [phone]   Note: PRIMARY CARE PROVIDER Cari Caraway, [place] [phone]

## 2017-04-18 ENCOUNTER — Telehealth: Payer: Self-pay

## 2017-04-18 NOTE — Telephone Encounter (Signed)
-----   Message from Gardenia Phlegm, NP sent at 04/18/2017  3:46 PM EST ----- Regarding: FW: Diagnostic Mammogram Please arrange.  Thanks,  [place] ----- Message ----- From: Hoy Morn Sent: 04/18/2017   3:37 PM To: Gardenia Phlegm, NP Subject: [place]  Just spoke with patient, she states she normally has her mammograms done at [facility] and she wishes to return there.  Can an order please be faxed to them so she can schedule them?  Thank you

## 2017-04-18 NOTE — Telephone Encounter (Signed)
Info faxed to [facility] for patient to schedule mammogram.

## 2017-04-30 ENCOUNTER — Other Ambulatory Visit: Payer: Self-pay | Admitting: *Deleted

## 2017-04-30 MED ORDER — CLONAZEPAM 0.5 MG PO TABS: 0.5000 mg | ORAL_TABLET | Freq: Every evening | ORAL | 3 refills | Status: DC | PRN

## 2017-05-01 ENCOUNTER — Telehealth: Payer: Self-pay

## 2017-05-01 ENCOUNTER — Other Ambulatory Visit: Payer: Self-pay

## 2017-05-01 MED ORDER — CLONAZEPAM 0.5 MG PO TABS: 0.5000 mg | ORAL_TABLET | Freq: Every evening | ORAL | 3 refills | Status: DC | PRN

## 2017-05-01 NOTE — Telephone Encounter (Signed)
Prescription for clonazepam faxed to [facility]

## 2017-08-26 ENCOUNTER — Other Ambulatory Visit: Payer: Self-pay

## 2017-08-26 DIAGNOSIS — Z170 Estrogen receptor positive status [ER+]: Principal | ICD-10-CM

## 2017-08-26 DIAGNOSIS — C50311 Malignant neoplasm of lower-inner quadrant of right female breast: Secondary | ICD-10-CM

## 2017-08-26 NOTE — Progress Notes (Signed)
[place]  Telephone:([phone]) [phone] Fax:(336) [phone]     ID: [name] DOB: 29-Oct-1950  MR#: [id number]  MWU#:[id number]  Patient Care Team: Cari Caraway, MD as PCP - General (Family Medicine) Magrinat, Virgie Dad, MD as Consulting Physician (Oncology) Rolm Bookbinder, MD as Consulting Physician (General Surgery) Kyung Rudd, MD as Consulting Physician (Radiation Oncology) Christene Slates, MD as Physician Assistant (Radiology) Delice Bison, Charlestine Massed, NP as Nurse Practitioner (Hematology and Oncology) OTHER MD:  CHIEF COMPLAINT: Estrogen receptor positive breast cancer  CURRENT TREATMENT: Tamoxifen   BREAST CANCER HISTORY: From the original intake note:  Victoria [name] had routine bilateral screening mammography at [facility] 07/20/2016. The breast density was category B. In the right breast lower inner quadrant there was an oval mass associated with architectural distortion. On 08/02/2016 the patient was brought back for further imaging. Right diagnostic mammography confirmed a 0.5 cm all nodule in the right breast at the 4:00 radiant. By ultrasonography this measured 0.4 cm and had lobulated margins.  Biopsy of the right breast mass in question 08/13/2016 showed ([id number]) invasive ductal carcinoma, grade 1, estrogen receptor 100% positive, progesterone receptor 100% positive, both with strong staining intensity, with an MIB-1 of 2%, and no HER-2 notification, the signals ratio being 1.27 and the number per cell 2.10.  The patient's subsequent history is as detailed below  INTERVAL HISTORY: [name] returns today for follow-up and treatment of her estrogen receptor positive breast cancer. She continues on tamoxifen, with fair tolerance. She has intense frequent hot flashes. She takes Neurontin at night with clonazepam.  That helps her insomnia and the nighttime hot flashes.  She denies issues with increased vaginal discharge.    REVIEW OF SYSTEMS: [name] reports that  she walks with her husband in the mornings for 45 minutes 2-3 miles. She also does some gardening. She is still having some issues with insomnia. She tries not to drink alcohol and caffeine in order to improve sleep at night. She denies unusual headaches, visual changes, nausea, vomiting, or dizziness. There has been no unusual cough, phlegm production, or pleurisy. This been no change in bowel or bladder habits. She denies unexplained fatigue or unexplained weight loss, bleeding, rash, or fever. A detailed review of systems was otherwise stable.    PAST MEDICAL HISTORY: Past Medical History:  Diagnosis Date  . Arthritis    neck  . Cancer ([place]) 07/2016   right breast cancer  . Depression   . Family history of adverse reaction to anesthesia    mother had cardiac arrest and PONV  . Headache    migraine  . History of kidney stones   . No pertinent past medical history     PAST SURGICAL HISTORY: Past Surgical History:  Procedure Laterality Date  . BACK SURGERY     diskectomy L5-6  . BREAST LUMPECTOMY WITH RADIOACTIVE SEED AND SENTINEL LYMPH NODE BIOPSY Right 09/03/2016   Procedure: RIGHT BREAST LUMPECTOMY WITH RADIOACTIVE SEED AND RIGHT AXILLARY SENTINEL NODE BIOPSY;  Surgeon: Rolm Bookbinder, MD;  Location: [facility];  Service: General;  Laterality: Right;  . CESAREAN SECTION     triplets  . DILATION AND CURETTAGE OF UTERUS    . KNEE ARTHROSCOPY Right   . RE-EXCISION OF BREAST LUMPECTOMY Right 09/11/2016   Procedure: RIGHT RE-EXCISION OF BREAST LUMPECTOMY;  Surgeon: Rolm Bookbinder, MD;  Location: [facility];  Service: General;  Laterality: Right;    FAMILY HISTORY Family History  Problem Relation Age of Onset  . Breast cancer Paternal  Aunt   . Breast cancer Cousin   The patient's father died from complications of emphysema at age 29. The patient's mother died following a stroke at age 65. The patient had no brothers, 1 sister. A paternal aunt had breast cancer in  her 48s and a paternal second cousin breast cancer in her 84s. There is no history of ovarian cancer in the family  GYNECOLOGIC HISTORY:  No LMP recorded. Patient is postmenopausal. Menarche age 17, first live birth age 23. The patient is GX P3. She stopped having periods in 2004. She took hormone replacement until 2018, when she was diagnosed with breast cancer.  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 Her husband [name] is a Games developer. Her children, from a prior marriage, are Victoria [name] who lives in [place] and is an Chief Financial Officer, Victoria [name], who is completing her Spanish literature PhD program at [employer] May 2019 and will be working in a 10-year track position at [employer] (and whose husband is from [place]), and Victoria [name], who works in [place].    ADVANCED DIRECTIVES: The patient tells me she has named her daughter [name] as her healthcare power of attorney. [name] can be contacted at [phone].   HEALTH MAINTENANCE: Social History   Tobacco Use  . Smoking status: Never Smoker  . Smokeless tobacco: Never Used  Substance Use Topics  . Alcohol use: Yes    Comment: occ  . Drug use: No     Colonoscopy: 2016/[facility]     PAP:  Bone density: 2013   Allergies  Allergen Reactions  . Darvon [Propoxyphene Hcl] Other (See Comments)    hyperactivity  . Penicillins Itching    Has patient had a PCN reaction causing immediate rash, facial/tongue/throat swelling, SOB or lightheadedness with hypotension: No Has patient had a PCN reaction causing severe rash involving mucus membranes or skin necrosis: No Has patient had a PCN reaction that required hospitalization: No Has patient had a PCN reaction occurring within the last 10 years: No If all of the above answers are "NO", then may proceed with Cephalosporin use.     Current Outpatient Medications  Medication Sig Dispense Refill  .  acetaminophen (TYLENOL) 500 MG tablet Take 1,000 mg by mouth every 6 (six) hours as needed for mild pain or headache.    . calcium-vitamin D (OSCAL WITH D) 250-125 MG-UNIT tablet Take 1 tablet by mouth daily.    . cholecalciferol (VITAMIN D) 1000 UNITS tablet Take 1,000 Units by mouth daily.     . clonazePAM (KLONOPIN) 0.5 MG tablet Take 1-2 tablets (0.5-1 mg total) by mouth at bedtime as needed (sleep). 60 tablet 3  . gabapentin (NEURONTIN) 300 MG capsule Take 2 capsules (600 mg total) by mouth at bedtime. (Patient taking differently: Take 300 mg by mouth at bedtime. ) 90 capsule 4  . glucosamine-chondroitin 500-400 MG tablet Take 1 tablet by mouth 2 (two) times daily.     Marland Kitchen ibuprofen (ADVIL,MOTRIN) 200 MG tablet Take 200 mg by mouth every 8 (eight) hours as needed.    . multivitamin-iron-minerals-folic acid (CENTRUM) chewable tablet Chew 1 tablet by mouth daily.    . sertraline (ZOLOFT) 100 MG tablet Take 150 mg by mouth daily.    . tamoxifen (NOLVADEX) 20 MG tablet Take 20 mg by mouth daily.    . vitamin E 400 UNIT capsule Take 400 Units by mouth daily.     No current  facility-administered medications for this visit.     OBJECTIVE: Middle-aged white woman in no acute distress  Vitals:   08/27/17 0849  BP: (!) 96/53  Pulse: 71  Resp: 18  Temp: 98.6 F (37 C)  SpO2: 100%     Body mass index is 25.37 kg/m.    ECOG FS:0 - Asymptomatic  Sclerae unicteric, pupils round and equal Oropharynx clear and moist No cervical or supraclavicular adenopathy Lungs no rales or rhonchi Heart regular rate and rhythm Abd soft, nontender, positive bowel sounds MSK no focal spinal tenderness, no upper extremity lymphedema Neuro: nonfocal, well oriented, appropriate affect Breasts: The right breast has undergone lumpectomy followed by radiation.  There is no evidence of local recurrence.  The cosmetic result is excellent.  The left breast is benign.  Both axillae are benign.  LAB RESULTS:  CMP       Component Value Date/Time   NA 139 09/11/2016 0651   NA 143 08/22/2016 0831   K 4.0 09/11/2016 0651   K 4.3 08/22/2016 0831   CL 107 09/11/2016 0651   CO2 21 (L) 09/11/2016 0651   CO2 26 08/22/2016 0831   GLUCOSE 100 (H) 09/11/2016 0651   GLUCOSE 87 08/22/2016 0831   BUN 11 09/11/2016 0651   BUN 8.7 08/22/2016 0831   CREATININE 0.65 09/11/2016 0651   CREATININE 0.9 08/22/2016 0831   CALCIUM 8.7 (L) 09/11/2016 0651   CALCIUM 9.3 08/22/2016 0831   PROT 7.1 08/22/2016 0831   ALBUMIN 4.3 08/22/2016 0831   AST 29 08/22/2016 0831   ALT 29 08/22/2016 0831   ALKPHOS 85 08/22/2016 0831   BILITOT 0.35 08/22/2016 0831   GFRNONAA >60 09/11/2016 0651   GFRAA >60 09/11/2016 0651    No results found for: Ronnald Ramp, A1GS, A2GS, BETS, BETA2SER, GAMS, MSPIKE, SPEI  No results found for: Nils Pyle, [facility]  Lab Results  Component Value Date   WBC 3.5 (L) 08/27/2017   NEUTROABS 2.0 08/27/2017   HGB 12.6 08/27/2017   HCT 38.0 08/27/2017   MCV 88.2 08/27/2017   PLT 175 08/27/2017      Chemistry      Component Value Date/Time   NA 139 09/11/2016 0651   NA 143 08/22/2016 0831   K 4.0 09/11/2016 0651   K 4.3 08/22/2016 0831   CL 107 09/11/2016 0651   CO2 21 (L) 09/11/2016 0651   CO2 26 08/22/2016 0831   BUN 11 09/11/2016 0651   BUN 8.7 08/22/2016 0831   CREATININE 0.65 09/11/2016 0651   CREATININE 0.9 08/22/2016 0831      Component Value Date/Time   CALCIUM 8.7 (L) 09/11/2016 0651   CALCIUM 9.3 08/22/2016 0831   ALKPHOS 85 08/22/2016 0831   AST 29 08/22/2016 0831   ALT 29 08/22/2016 0831   BILITOT 0.35 08/22/2016 0831       No results found for: LABCA2  No components found for: [id number]  No results for input(s): INR in the last 168 hours.  Urinalysis No results found for: COLORURINE, APPEARANCEUR, LABSPEC, PHURINE, GLUCOSEU, HGBUR, BILIRUBINUR, KETONESUR, PROTEINUR, UROBILINOGEN, NITRITE, LEUKOCYTESUR   STUDIES: He is scheduled  for mammography at [facility] 09/05/2017  ELIGIBLE FOR AVAILABLE RESEARCH PROTOCOL: No  ASSESSMENT: 67 y.o. [place] woman status post right breast lower inner quadrant biopsy 08/13/2016 for a clinical T1a N0, stage IA invasive ductal carcinoma, grade 1, estrogen and progesterone receptor positive, HER-2 not amplified, with an MIB-1 of 2%  (1) status post right lumpectomy and sentinel lymph node  sampling 09/03/2016 for a pT1b pN0, stage IA invasive ductal carcinoma, grade 1, with a broadly positive anterior margin   (a) margins cleared with additional surgery 09/11/2016  (2) adjuvant radiation6/20/2018 to 11/07/2016 Site/dose:    1. The Right breast was treated to 42.5 Gy in 17 fractions at 2.5 Gy per fraction. 2. The Right breast was boosted to 7.5 Gy in 3 fractions at 2.5 Gy per fraction.  (3) Tamoxifen started 12/25/2016  PLAN: Shavy is now a year out from definitive surgery for her breast cancer with no evidence of disease recurrence.  This is favorable.  She is tolerating tamoxifen generally well except for the hot flashes.  She is already on a fairly good dose of gabapentin at bedtime, but of course that does not take care of the problem during the day.  Dr. Leonides Schanz suggested switching the sertraline to venlafaxine.  I think that is a good idea for 2 reasons the first of course is that venlafaxine is more effective in terms of hot flash control than sertraline.  The second 1 is that there is an interaction in the metabolism of tamoxifen with sertraline which we do not believe is of clinical significance but which nevertheless remains in the back of our minds.  There is no such interaction with venlafaxine.  Accordingly she is going to go on 800 mg of sertraline beginning on 08/28/2017 and a week later on 09/04/2017 she will drop it to 50 mg.  She will stop the sertraline altogether on 09/11/2017.  At the same time she will start venlafaxine 75 mg daily on 08/28/2017 and increase it to  150 mg daily on 09/11/2017.  She should know by mid July whether there is a new antidepressant and works well for her as an antidepressant, has no significant side effects, and controls her hot flashes.  If any of those are problem she will call and let us know.  Otherwise she will see me again in 6 months.  She knows to call for any other issues that may develop before that visit.   Magrinat, Virgie Dad, MD  08/27/17 9:07 AM Medical Oncology and Hematology [facility] [address] [name], [place] [address] Tel. [phone]    Fax. [phone]  This document serves as a record of services personally performed by Lurline Del, MD. It was created on his behalf by Sheron Nightingale, a trained medical scribe. The creation of this record is based on the scribe's personal observations and the provider's statements to them.   I have reviewed the above documentation for accuracy and completeness, and I agree with the above.

## 2017-08-27 ENCOUNTER — Inpatient Hospital Stay: Payer: BC Managed Care – PPO | Attending: Oncology | Admitting: Oncology

## 2017-08-27 ENCOUNTER — Inpatient Hospital Stay: Payer: BC Managed Care – PPO

## 2017-08-27 ENCOUNTER — Telehealth: Payer: Self-pay | Admitting: Oncology

## 2017-08-27 VITALS — BP 96/53 | HR 71 | Temp 98.6°F | Resp 18 | Ht 64.0 in | Wt 147.8 lb

## 2017-08-27 DIAGNOSIS — Z803 Family history of malignant neoplasm of breast: Secondary | ICD-10-CM | POA: Diagnosis not present

## 2017-08-27 DIAGNOSIS — Z79810 Long term (current) use of selective estrogen receptor modulators (SERMs): Secondary | ICD-10-CM | POA: Diagnosis not present

## 2017-08-27 DIAGNOSIS — M129 Arthropathy, unspecified: Secondary | ICD-10-CM | POA: Insufficient documentation

## 2017-08-27 DIAGNOSIS — Z87442 Personal history of urinary calculi: Secondary | ICD-10-CM | POA: Insufficient documentation

## 2017-08-27 DIAGNOSIS — C50311 Malignant neoplasm of lower-inner quadrant of right female breast: Secondary | ICD-10-CM | POA: Diagnosis present

## 2017-08-27 DIAGNOSIS — Z170 Estrogen receptor positive status [ER+]: Secondary | ICD-10-CM | POA: Insufficient documentation

## 2017-08-27 DIAGNOSIS — R232 Flushing: Secondary | ICD-10-CM | POA: Diagnosis not present

## 2017-08-27 DIAGNOSIS — Z79899 Other long term (current) drug therapy: Secondary | ICD-10-CM | POA: Diagnosis not present

## 2017-08-27 DIAGNOSIS — F329 Major depressive disorder, single episode, unspecified: Secondary | ICD-10-CM | POA: Insufficient documentation

## 2017-08-27 LAB — CMP (CANCER CENTER ONLY)
ALT: 26 U/L (ref 0–55)
AST: 30 U/L (ref 5–34)
Albumin: 4.1 g/dL (ref 3.5–5.0)
Alkaline Phosphatase: 78 U/L (ref 40–150)
Anion gap: 4 (ref 3–11)
BUN: 12 mg/dL (ref 7–26)
CHLORIDE: 107 mmol/L (ref 98–109)
CO2: 29 mmol/L (ref 22–29)
Calcium: 9.2 mg/dL (ref 8.4–10.4)
Creatinine: 0.9 mg/dL (ref 0.60–1.10)
Glucose, Bld: 94 mg/dL (ref 70–140)
Potassium: 4.9 mmol/L (ref 3.5–5.1)
Sodium: 140 mmol/L (ref 136–145)
Total Bilirubin: 0.3 mg/dL (ref 0.2–1.2)
Total Protein: 6.8 g/dL (ref 6.4–8.3)

## 2017-08-27 LAB — CBC WITH DIFFERENTIAL (CANCER CENTER ONLY)
BASOS ABS: 0 10*3/uL (ref 0.0–0.1)
BASOS PCT: 1 %
EOS ABS: 0 10*3/uL (ref 0.0–0.5)
EOS PCT: 1 %
HCT: 38 % (ref 34.8–46.6)
Hemoglobin: 12.6 g/dL (ref 11.6–15.9)
LYMPHS PCT: 31 %
Lymphs Abs: 1.1 10*3/uL (ref 0.9–3.3)
MCH: 29.3 pg (ref 25.1–34.0)
MCHC: 33.2 g/dL (ref 31.5–36.0)
MCV: 88.2 fL (ref 79.5–101.0)
Monocytes Absolute: 0.4 10*3/uL (ref 0.1–0.9)
Monocytes Relative: 11 %
Neutro Abs: 2 10*3/uL (ref 1.5–6.5)
Neutrophils Relative %: 56 %
PLATELETS: 175 10*3/uL (ref 145–400)
RBC: 4.31 MIL/uL (ref 3.70–5.45)
RDW: 13.5 % (ref 11.2–14.5)
WBC: 3.5 10*3/uL — AB (ref 3.9–10.3)

## 2017-08-27 MED ORDER — VENLAFAXINE HCL ER 150 MG PO CP24: ORAL_CAPSULE | ORAL | 4 refills | Status: DC

## 2017-08-27 MED ORDER — SERTRALINE HCL 50 MG PO TABS: ORAL_TABLET | ORAL | 0 refills | Status: DC

## 2017-08-27 MED ORDER — VENLAFAXINE HCL ER 75 MG PO CP24: ORAL_CAPSULE | ORAL | 0 refills | Status: DC

## 2017-08-27 MED ORDER — TAMOXIFEN CITRATE 20 MG PO TABS: 20.0000 mg | ORAL_TABLET | Freq: Every day | ORAL | 4 refills | Status: DC

## 2017-08-27 NOTE — Telephone Encounter (Signed)
Gave patient AVs and calendar of upcoming November appointments.  °

## 2017-09-05 ENCOUNTER — Encounter: Payer: Self-pay | Admitting: Adult Health

## 2017-11-11 ENCOUNTER — Other Ambulatory Visit: Payer: Self-pay | Admitting: Oncology

## 2017-12-09 ENCOUNTER — Other Ambulatory Visit: Payer: Self-pay | Admitting: Oncology

## 2018-02-13 ENCOUNTER — Other Ambulatory Visit: Payer: Self-pay | Admitting: Oncology

## 2018-02-14 ENCOUNTER — Other Ambulatory Visit: Payer: Self-pay | Admitting: Oncology

## 2018-02-25 ENCOUNTER — Other Ambulatory Visit: Payer: Self-pay | Admitting: *Deleted

## 2018-02-25 ENCOUNTER — Other Ambulatory Visit: Payer: Self-pay | Admitting: Oncology

## 2018-03-03 NOTE — Progress Notes (Signed)
[place]  Telephone:([phone]) [phone] Fax:(336) [phone]     ID: Victoria [name] DOB: [birth year]  MR#: [id number]  VEL#:[id number]  Patient Care Team: Cari Caraway, MD as PCP - General (Family Medicine) Magrinat, Virgie Dad, MD as Consulting Physician (Oncology) Rolm Bookbinder, MD as Consulting Physician (General Surgery) Kyung Rudd, MD as Consulting Physician (Radiation Oncology) Christene Slates, MD as Physician Assistant (Radiology) Delice Bison, Charlestine Massed, NP as Nurse Practitioner (Hematology and Oncology) OTHER MD:  CHIEF COMPLAINT: Estrogen receptor positive breast cancer  CURRENT TREATMENT: Tamoxifen   BREAST CANCER HISTORY: From the original intake note:  Victoria [name] had routine bilateral screening mammography at [facility] 07/20/2016. The breast density was category B. In the right breast lower inner quadrant there was an oval mass associated with architectural distortion. On 08/02/2016 the patient was brought back for further imaging. Right diagnostic mammography confirmed a 0.5 cm all nodule in the right breast at the 4:00 radiant. By ultrasonography this measured 0.4 cm and had lobulated margins.  Biopsy of the right breast mass in question 08/13/2016 showed ([id number]) invasive ductal carcinoma, grade 1, estrogen receptor 100% positive, progesterone receptor 100% positive, both with strong staining intensity, with an MIB-1 of 2%, and no HER-2 notification, the signals ratio being 1.27 and the number per cell 2.10.  The patient's subsequent history is as detailed below  INTERVAL HISTORY: Victoria [name] returns today for follow-up and treatment of her estrogen receptor positive breast cancer. She continues on tamoxifen with moderate hot flashes as her only significant side effect.  She does not have significant vaginal wetness.  She still has to use lubricants.    Since the last visit here she underwent bilateral diagnostic mammography with tomography at [facility] on 09/05/2017,  showing the breast density to be category B.  There was no evidence of malignancy.   REVIEW OF SYSTEMS: [name] walks 2-1/2 to 3 miles every morning with her husband.  As her main form of exercise.  She denies unusual headaches, visual changes, nausea, vomiting, or dizziness. There has been no unusual cough, phlegm production, or pleurisy. This been no change in bowel or bladder habits. She denies unexplained fatigue or unexplained weight loss, bleeding, rash, or fever. A detailed review of systems was otherwise stable. ;   PAST MEDICAL HISTORY: Past Medical History:  Diagnosis Date  . Arthritis    neck  . Cancer ([place]) 07/2016   right breast cancer  . Depression   . Family history of adverse reaction to anesthesia    mother had cardiac arrest and PONV  . Headache    migraine  . History of kidney stones   . No pertinent past medical history     PAST SURGICAL HISTORY: Past Surgical History:  Procedure Laterality Date  . BACK SURGERY     diskectomy L5-6  . BREAST LUMPECTOMY WITH RADIOACTIVE SEED AND SENTINEL LYMPH NODE BIOPSY Right 09/03/2016   Procedure: RIGHT BREAST LUMPECTOMY WITH RADIOACTIVE SEED AND RIGHT AXILLARY SENTINEL NODE BIOPSY;  Surgeon: Rolm Bookbinder, MD;  Location: [facility];  Service: General;  Laterality: Right;  . CESAREAN SECTION     triplets  . DILATION AND CURETTAGE OF UTERUS    . KNEE ARTHROSCOPY Right   . RE-EXCISION OF BREAST LUMPECTOMY Right 66/22/2018   Procedure: RIGHT RE-EXCISION OF BREAST LUMPECTOMY;  Surgeon: Rolm Bookbinder, MD;  Location: [place];  Service: General;  Laterality: Right;    FAMILY HISTORY Family History  Problem Relation Age of Onset  . Breast cancer Paternal Aunt   .  Breast cancer Cousin   The patient's father died from complications of emphysema at age 29. The patient's mother died following a stroke at age 14. The patient had no brothers, 1 sister. A paternal aunt had breast cancer in her 26s and a paternal  second cousin breast cancer in her 31s. There is no history of ovarian cancer in the family  GYNECOLOGIC HISTORY:  No LMP recorded. Patient is postmenopausal. Menarche age 40, first live birth age 34. The patient is GX P3. She stopped having periods in 2004. She took hormone replacement until 2018, when she was diagnosed with breast cancer.  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 Her husband [name] is a Games developer. Her children, from a prior marriage, are Victoria [name] who lives in [place] and is an Chief Financial Officer, Victoria [name], who is completing her Spanish literature PhD program at [employer] May 2019 and will be working in a 10-year track position at [employer] (and whose husband is from [place]), and Victoria [name], who works in [place].    ADVANCED DIRECTIVES: The patient tells me she has named her daughter [name] as her healthcare power of attorney. [name] can be contacted at [phone].   HEALTH MAINTENANCE: Social History   Tobacco Use  . Smoking status: Never Smoker  . Smokeless tobacco: Never Used  Substance Use Topics  . Alcohol use: Yes    Comment: occ  . Drug use: No     Colonoscopy: 2016/[facility]     PAP:  Bone density: 2013   Allergies  Allergen Reactions  . Darvon [Propoxyphene Hcl] Other (See Comments)    hyperactivity  . Penicillins Itching    Has patient had a PCN reaction causing immediate rash, facial/tongue/throat swelling, SOB or lightheadedness with hypotension: No Has patient had a PCN reaction causing severe rash involving mucus membranes or skin necrosis: No Has patient had a PCN reaction that required hospitalization: No Has patient had a PCN reaction occurring within the last 10 years: No If all of the above answers are "NO", then may proceed with Cephalosporin use.     Current Outpatient Medications  Medication Sig Dispense Refill  . acetaminophen (TYLENOL) 500 MG  tablet Take 1,000 mg by mouth every 6 (six) hours as needed for mild pain or headache.    . calcium-vitamin D (OSCAL WITH D) 250-125 MG-UNIT tablet Take 1 tablet by mouth daily.    . cholecalciferol (VITAMIN D) 1000 UNITS tablet Take 1,000 Units by mouth daily.     . clonazePAM (KLONOPIN) 0.5 MG tablet TAKE 1 TO 2 TABLETS BY MOUTH EVERY NIGHTAT BEDTIME AS NEEDED FOR SLEEP 60 tablet 0  . gabapentin (NEURONTIN) 300 MG capsule TAKE 2 CAPSULES BY MOUTH EVERY NIGHT AT BEDTIME 90 capsule 4  . glucosamine-chondroitin 500-400 MG tablet Take 1 tablet by mouth 2 (two) times daily.     Marland Kitchen ibuprofen (ADVIL,MOTRIN) 200 MG tablet Take 200 mg by mouth every 8 (eight) hours as needed.    . multivitamin-iron-minerals-folic acid (CENTRUM) chewable tablet Chew 1 tablet by mouth daily.    . sertraline (ZOLOFT) 50 MG tablet Take two tablets daily starting 08/28/2017, drop to one tablet daily starting 09/04/2017, stop 05/22 2019 25 tablet 0  . tamoxifen (NOLVADEX) 20 MG tablet Take 1 tablet (20 mg total) by mouth daily. 90 tablet 4  . venlafaxine XR (EFFEXOR-XR) 150 MG 24 hr capsule Take one capsule (150 mg) daily with breakfast starting  September 21, 2017 90 capsule 4  . venlafaxine XR (EFFEXOR-XR) 75 MG 24 hr capsule Take one tablet daily starting 08/28/2017, increase to two tablets daily starting 09/11/2017 50 capsule 0  . vitamin E 400 UNIT capsule Take 400 Units by mouth daily.     No current facility-administered medications for this visit.     OBJECTIVE: Middle-aged white woman who appears well  Vitals:   03/05/18 1515  BP: 117/65  Pulse: 85  Resp: 18  Temp: 98.4 F (36.9 C)  SpO2: 97%     Body mass index is 25.78 kg/m.    ECOG FS:0 - Asymptomatic  Sclerae unicteric, EOMs intact No cervical or supraclavicular adenopathy Lungs no rales or rhonchi Heart regular rate and rhythm Abd soft, nontender, positive bowel sounds MSK no focal spinal tenderness, no right upper extremity lymphedema Neuro: nonfocal,  well oriented, appropriate affect Breasts: The right breast is status post lumpectomy and radiation.  There is no evidence for local recurrence.  Left breast is benign.  Both axillae are benign.  LAB RESULTS:  CMP     Component Value Date/Time   NA 141 03/05/2018 1455   NA 143 08/22/2016 0831   K 3.8 03/05/2018 1455   K 4.3 08/22/2016 0831   CL 106 03/05/2018 1455   CO2 27 03/05/2018 1455   CO2 26 08/22/2016 0831   GLUCOSE 91 03/05/2018 1455   GLUCOSE 87 08/22/2016 0831   BUN 12 03/05/2018 1455   BUN 8.7 08/22/2016 0831   CREATININE 0.90 03/05/2018 1455   CREATININE 0.9 08/22/2016 0831   CALCIUM 9.1 03/05/2018 1455   CALCIUM 9.3 08/22/2016 0831   PROT 7.0 03/05/2018 1455   PROT 7.1 08/22/2016 0831   ALBUMIN 4.0 03/05/2018 1455   ALBUMIN 4.3 08/22/2016 0831   AST 33 03/05/2018 1455   AST 29 08/22/2016 0831   ALT 44 03/05/2018 1455   ALT 29 08/22/2016 0831   ALKPHOS 84 03/05/2018 1455   ALKPHOS 85 08/22/2016 0831   BILITOT 0.2 (L) 03/05/2018 1455   BILITOT 0.35 08/22/2016 0831   GFRNONAA >60 03/05/2018 1455   GFRAA >60 03/05/2018 1455    No results found for: TOTALPROTELP, ALBUMINELP, A1GS, A2GS, BETS, BETA2SER, GAMS, MSPIKE, SPEI  No results found for: Nils Pyle, [facility]  Lab Results  Component Value Date   WBC 4.6 03/05/2018   NEUTROABS 2.3 03/05/2018   HGB 13.0 03/05/2018   HCT 40.1 03/05/2018   MCV 93.5 03/05/2018   PLT 173 03/05/2018      Chemistry      Component Value Date/Time   NA 141 03/05/2018 1455   NA 143 08/22/2016 0831   K 3.8 03/05/2018 1455   K 4.3 08/22/2016 0831   CL 106 03/05/2018 1455   CO2 27 03/05/2018 1455   CO2 26 08/22/2016 0831   BUN 12 03/05/2018 1455   BUN 8.7 08/22/2016 0831   CREATININE 0.90 03/05/2018 1455   CREATININE 0.9 08/22/2016 0831      Component Value Date/Time   CALCIUM 9.1 03/05/2018 1455   CALCIUM 9.3 08/22/2016 0831   ALKPHOS 84 03/05/2018 1455   ALKPHOS 85 08/22/2016 0831   AST 33  03/05/2018 1455   AST 29 08/22/2016 0831   ALT 44 03/05/2018 1455   ALT 29 08/22/2016 0831   BILITOT 0.2 (L) 03/05/2018 1455   BILITOT 0.35 08/22/2016 0831       No results found for: LABCA2  No components found for: [id number]  No results for input(s): INR  in the last 168 hours.  Urinalysis No results found for: COLORURINE, APPEARANCEUR, LABSPEC, PHURINE, GLUCOSEU, HGBUR, BILIRUBINUR, KETONESUR, PROTEINUR, UROBILINOGEN, NITRITE, LEUKOCYTESUR   STUDIES: She underwent mammography at [facility] 09/05/2017 with no evidence of disease  ELIGIBLE FOR AVAILABLE RESEARCH PROTOCOL: No  ASSESSMENT: 67 y.o. [place] woman status post right breast lower inner quadrant biopsy 08/13/2016 for a clinical T1a N0, stage IA invasive ductal carcinoma, grade 1, estrogen and progesterone receptor positive, HER-2 not amplified, with an MIB-1 of 2%  (1) status post right lumpectomy and sentinel lymph node sampling 09/03/2016 for a pT1b pN0, stage IA invasive ductal carcinoma, grade 1, with a broadly positive anterior margin   (a) margins cleared with additional surgery 09/11/2016  (2) adjuvant radiation6/20/2018 to 11/07/2016 Site/dose:    1. The Right breast was treated to 42.5 Gy in 17 fractions at 2.5 Gy per fraction. 2. The Right breast was boosted to 7.5 Gy in 3 fractions at 2.5 Gy per fraction.  (3) Tamoxifen started 12/25/2016  PLAN: Saja is now a year and a half out from definitive surgery for breast cancer with no evidence of disease recurrence.  This is very favorable.  She is tolerating tamoxifen well and the plan will be to continue that a total of 5 years.  She made the transition from Zoloft to venlafaxine without any complications.  She is planning a trip to [place] and is concerned that the long flight and then being [place] very [place]/low pressure levels might cause lymphedema.  We discussed the data for all that which is questionable.  Nevertheless I went ahead and wrote her  for 2 compression sleeves that she may use at her discretion  She will have lab work drawn through her primary care physician Dr. Leonides Schanz March 2020.  That will be all we need for her visit with me late May.   From that point we will start seeing her on a once a year basis.  She knows to call for any other issues that may develop before that visit.      Magrinat, Virgie Dad, MD  03/05/18 3:45 PM Medical Oncology and Hematology [facility] [address] [place], [place] [address] Tel. [phone]    Fax. [phone]    Elie Goody, am acting as scribe for Dr. Virgie Dad. Magrinat.  I, Lurline Del MD, have reviewed the above documentation for accuracy and completeness, and I agree with the above.

## 2018-03-04 ENCOUNTER — Other Ambulatory Visit: Payer: Self-pay | Admitting: *Deleted

## 2018-03-04 DIAGNOSIS — Z170 Estrogen receptor positive status [ER+]: Principal | ICD-10-CM

## 2018-03-04 DIAGNOSIS — C50311 Malignant neoplasm of lower-inner quadrant of right female breast: Secondary | ICD-10-CM

## 2018-03-05 ENCOUNTER — Telehealth: Payer: Self-pay | Admitting: Oncology

## 2018-03-05 ENCOUNTER — Inpatient Hospital Stay (HOSPITAL_BASED_OUTPATIENT_CLINIC_OR_DEPARTMENT_OTHER): Payer: BC Managed Care – PPO | Admitting: Oncology

## 2018-03-05 ENCOUNTER — Inpatient Hospital Stay: Payer: BC Managed Care – PPO | Attending: Oncology

## 2018-03-05 VITALS — BP 117/65 | HR 85 | Temp 98.4°F | Resp 18 | Ht 64.0 in | Wt 150.2 lb

## 2018-03-05 DIAGNOSIS — Z79810 Long term (current) use of selective estrogen receptor modulators (SERMs): Secondary | ICD-10-CM | POA: Diagnosis not present

## 2018-03-05 DIAGNOSIS — Z87442 Personal history of urinary calculi: Secondary | ICD-10-CM | POA: Insufficient documentation

## 2018-03-05 DIAGNOSIS — Z803 Family history of malignant neoplasm of breast: Secondary | ICD-10-CM

## 2018-03-05 DIAGNOSIS — Z170 Estrogen receptor positive status [ER+]: Secondary | ICD-10-CM

## 2018-03-05 DIAGNOSIS — Z79899 Other long term (current) drug therapy: Secondary | ICD-10-CM | POA: Diagnosis not present

## 2018-03-05 DIAGNOSIS — C50311 Malignant neoplasm of lower-inner quadrant of right female breast: Secondary | ICD-10-CM | POA: Insufficient documentation

## 2018-03-05 DIAGNOSIS — F329 Major depressive disorder, single episode, unspecified: Secondary | ICD-10-CM

## 2018-03-05 DIAGNOSIS — R232 Flushing: Secondary | ICD-10-CM | POA: Insufficient documentation

## 2018-03-05 DIAGNOSIS — M129 Arthropathy, unspecified: Secondary | ICD-10-CM

## 2018-03-05 DIAGNOSIS — R51 Headache: Secondary | ICD-10-CM | POA: Diagnosis not present

## 2018-03-05 LAB — CBC WITH DIFFERENTIAL (CANCER CENTER ONLY)
Abs Immature Granulocytes: 0.01 10*3/uL (ref 0.00–0.07)
BASOS ABS: 0 10*3/uL (ref 0.0–0.1)
BASOS PCT: 1 %
EOS ABS: 0.1 10*3/uL (ref 0.0–0.5)
Eosinophils Relative: 2 %
HCT: 40.1 % (ref 36.0–46.0)
Hemoglobin: 13 g/dL (ref 12.0–15.0)
Immature Granulocytes: 0 %
LYMPHS ABS: 1.5 10*3/uL (ref 0.7–4.0)
Lymphocytes Relative: 34 %
MCH: 30.3 pg (ref 26.0–34.0)
MCHC: 32.4 g/dL (ref 30.0–36.0)
MCV: 93.5 fL (ref 80.0–100.0)
Monocytes Absolute: 0.6 10*3/uL (ref 0.1–1.0)
Monocytes Relative: 13 %
NEUTROS PCT: 50 %
NRBC: 0 % (ref 0.0–0.2)
Neutro Abs: 2.3 10*3/uL (ref 1.7–7.7)
PLATELETS: 173 10*3/uL (ref 150–400)
RBC: 4.29 MIL/uL (ref 3.87–5.11)
RDW: 12.9 % (ref 11.5–15.5)
WBC: 4.6 10*3/uL (ref 4.0–10.5)

## 2018-03-05 LAB — CMP (CANCER CENTER ONLY)
ALK PHOS: 84 U/L (ref 38–126)
ALT: 44 U/L (ref 0–44)
ANION GAP: 8 (ref 5–15)
AST: 33 U/L (ref 15–41)
Albumin: 4 g/dL (ref 3.5–5.0)
BUN: 12 mg/dL (ref 8–23)
CALCIUM: 9.1 mg/dL (ref 8.9–10.3)
CO2: 27 mmol/L (ref 22–32)
Chloride: 106 mmol/L (ref 98–111)
Creatinine: 0.9 mg/dL (ref 0.44–1.00)
Glucose, Bld: 91 mg/dL (ref 70–99)
Potassium: 3.8 mmol/L (ref 3.5–5.1)
SODIUM: 141 mmol/L (ref 135–145)
TOTAL PROTEIN: 7 g/dL (ref 6.5–8.1)
Total Bilirubin: 0.2 mg/dL — ABNORMAL LOW (ref 0.3–1.2)

## 2018-03-05 MED ORDER — VENLAFAXINE HCL ER 150 MG PO CP24: ORAL_CAPSULE | ORAL | 4 refills | Status: DC

## 2018-03-05 MED ORDER — GABAPENTIN 300 MG PO CAPS: 600.0000 mg | ORAL_CAPSULE | Freq: Every day | ORAL | 4 refills | Status: DC

## 2018-03-05 NOTE — Telephone Encounter (Signed)
Printed calendar and avs. °

## 2018-04-01 ENCOUNTER — Other Ambulatory Visit: Payer: Self-pay | Admitting: Oncology

## 2018-05-03 ENCOUNTER — Other Ambulatory Visit: Payer: Self-pay | Admitting: Oncology

## 2018-06-13 ENCOUNTER — Other Ambulatory Visit: Payer: Self-pay | Admitting: Oncology

## 2018-07-26 ENCOUNTER — Other Ambulatory Visit: Payer: Self-pay | Admitting: Oncology

## 2018-09-01 ENCOUNTER — Other Ambulatory Visit: Payer: Self-pay | Admitting: Family Medicine

## 2018-09-01 ENCOUNTER — Other Ambulatory Visit (HOSPITAL_COMMUNITY)
Admission: RE | Admit: 2018-09-01 | Discharge: 2018-09-01 | Disposition: A | Discharge status: Home / Self Care | Payer: BC Managed Care – PPO | Source: Ambulatory Visit | Attending: Family Medicine | Admitting: Family Medicine

## 2018-09-01 DIAGNOSIS — Z124 Encounter for screening for malignant neoplasm of cervix: Secondary | ICD-10-CM | POA: Insufficient documentation

## 2018-09-04 LAB — CYTOLOGY - PAP
Diagnosis: NEGATIVE
HPV: NOT DETECTED

## 2018-09-16 ENCOUNTER — Other Ambulatory Visit: Payer: Self-pay | Admitting: Oncology

## 2018-09-18 ENCOUNTER — Telehealth: Payer: Self-pay | Admitting: Oncology

## 2018-09-18 NOTE — Telephone Encounter (Signed)
Opened by accident

## 2018-09-18 NOTE — Telephone Encounter (Signed)
Called patient regarding upcoming Web[facility] appointment, left patient a voicemail and have converted this to a telephone visit due to no e-mail being listed.

## 2018-09-18 NOTE — Telephone Encounter (Signed)
Patient returned phone call regarding voicemail that was left about an upcoming Webex appointment, patient is notified and an e-mail has been sent.

## 2018-09-21 NOTE — Progress Notes (Signed)
[place]  [phone] [phone]     ID: Victoria [name] DOB: 1951-01-12  MR#: [id number]  MBB#:[id number]  Patient Care Team: Victoria Caraway, MD as PCP - General (Family Medicine) Victoria [name], Victoria Dad, MD as Consulting Physician (Oncology) Victoria Bookbinder, MD as Consulting Physician (General Surgery) Victoria Rudd, MD as Consulting Physician (Radiation Oncology) Victoria Slates, MD as Physician Assistant (Radiology) Victoria Bison Charlestine Massed, NP as Nurse Practitioner (Hematology and Oncology) OTHER MD:  I connected with Victoria [name] on 09/22/18 at  3:30 PM EDT by telephone visit and verified that I am speaking with the correct person using two identifiers.   I discussed the limitations, risks, security and privacy concerns of performing an evaluation and management service by telemedicine and the availability of in-person appointments. I also discussed with the patient that there may be a patient responsible charge related to this service. The patient expressed understanding and agreed to proceed.   Other persons participating in the visit and their role in the encounter: Victoria [name], scribe   Patient's location: work  Provider's location: [place]: Estrogen receptor positive breast cancer  CURRENT TREATMENT: Tamoxifen   INTERVAL HISTORY: Victoria [name] is contacted today for follow-up of her estrogen receptor positive breast cancer.   She continues on tamoxifen with good tolerance. She reports "horrible hot flashes," despite taking gabapentin and venlafaxine.  Since her last visit, she underwent bilateral diagnostic mammography with tomography at [facility] on 09/17/2018 showing: breast density category B; indeterminate 0.4 cm round mass in the left breast. Left breast ultrasound was then performed at that time, which confirmed no sonographic evidence of malignancy; the 0.5 cm oval simple cyst in the left breast is  benign.   REVIEW OF SYSTEMS: Victoria [name] reports doing well overall. She is still working; she notes they were working every other day since March until last week when they returned to full time. She works in her garden, and she and her husband walk about 3 miles 5-6 times a week. She reports her blood pressure runs about 100-110/60.   The patient denies unusual headaches, visual changes, nausea, vomiting, stiff neck, dizziness, or gait imbalance. There has been no cough, phlegm production, or pleurisy, no chest pain or pressure, and no change in bowel or bladder habits. The patient denies fever, rash, bleeding, unexplained fatigue or unexplained weight loss. A detailed review of systems was otherwise entirely negative.   BREAST CANCER HISTORY: From the original intake note:  [name] had routine bilateral screening mammography at [facility] 07/20/2016. The breast density was category B. In the right breast lower inner quadrant there was an oval mass associated with architectural distortion. On 08/02/2016 the patient was brought back for further imaging. Right diagnostic mammography confirmed a 0.5 cm all nodule in the right breast at the 4:00 radiant. By ultrasonography this measured 0.4 cm and had lobulated margins.  Biopsy of the right breast mass in question 08/13/2016 showed ([id number]) invasive ductal carcinoma, grade 1, estrogen receptor 100% positive, progesterone receptor 100% positive, both with strong staining intensity, with an MIB-1 of 2%, and no HER-2 notification, the signals ratio being 1.27 and the number per cell 2.10.  The patient's subsequent history is as detailed below   PAST MEDICAL HISTORY: Past Medical History:  Diagnosis Date  . Arthritis    neck  . Cancer ([place]) 07/2016   right breast cancer  . Depression   . Family history of adverse reaction to anesthesia  mother had cardiac arrest and PONV  . Headache    migraine  . History of kidney stones   . No pertinent past  medical history     PAST SURGICAL HISTORY: Past Surgical History:  Procedure Laterality Date  . BACK SURGERY     diskectomy L5-6  . BREAST LUMPECTOMY WITH RADIOACTIVE SEED AND SENTINEL LYMPH NODE BIOPSY Right 09/03/2016   Procedure: RIGHT BREAST LUMPECTOMY WITH RADIOACTIVE SEED AND RIGHT AXILLARY SENTINEL NODE BIOPSY;  Surgeon: Victoria Bookbinder, MD;  Location: [facility];  Service: General;  Laterality: Right;  . CESAREAN SECTION     triplets  . DILATION AND CURETTAGE OF UTERUS    . KNEE ARTHROSCOPY Right   . RE-EXCISION OF BREAST LUMPECTOMY Right 09/11/2016   Procedure: RIGHT RE-EXCISION OF BREAST LUMPECTOMY;  Surgeon: Victoria Bookbinder, MD;  Location: [place];  Service: General;  Laterality: Right;    FAMILY HISTORY Family History  Problem Relation Age of Onset  . Breast cancer Paternal Aunt   . Breast cancer Cousin   The patient's father died from complications of emphysema at age 17. The patient's mother died following a stroke at age 17. The patient had no brothers, 1 sister. A paternal aunt had breast cancer in her 85s and a paternal second cousin breast cancer in her 67s. There is no history of ovarian cancer in the family   GYNECOLOGIC HISTORY:  No LMP recorded. Patient is postmenopausal. Menarche age 47, first live birth age 61. The patient is GX P3. She stopped having periods in 2004. She took hormone replacement until 2018, when she was diagnosed with breast cancer.   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 Her husband Victoria [name] is a Games developer. Her children, from a prior marriage, are Victoria [name] who lives in [place] and is an Chief Financial Officer, Victoria [name], who is completing her Spanish literature PhD program at [employer] May 2019 and will be working in a 10-year track position at [employer] (and whose husband is from [place]), and Victoria [name], who works in [employer].    ADVANCED  DIRECTIVES: The patient tells me she has named her daughter Victoria [name] as her healthcare power of attorney. Victoria [name] can be contacted at [phone].   HEALTH MAINTENANCE: Social History   Tobacco Use  . Smoking status: Never Smoker  . Smokeless tobacco: Never Used  Substance Use Topics  . Alcohol use: Yes    Comment: occ  . Drug use: No     Colonoscopy: 2016/[facility]     PAP:  Bone density: 2013   Allergies  Allergen Reactions  . Darvon [Propoxyphene Hcl] Other (See Comments)    hyperactivity  . Penicillins Itching    Has patient had a PCN reaction causing immediate rash, facial/tongue/throat swelling, SOB or lightheadedness with hypotension: No Has patient had a PCN reaction causing severe rash involving mucus membranes or skin necrosis: No Has patient had a PCN reaction that required hospitalization: No Has patient had a PCN reaction occurring within the last 10 years: No If all of the above answers are "NO", then may proceed with Cephalosporin use.     Current Outpatient Medications  Medication Sig Dispense Refill  . calcium-vitamin D (OSCAL WITH D) 250-125 MG-UNIT tablet Take 1 tablet by mouth daily.    . cholecalciferol (VITAMIN D) 1000 UNITS tablet Take 1,000 Units by mouth daily.     . clonazePAM (KLONOPIN) 0.5 MG tablet TAEK 1-2  TABLETS BY MOUTH EVERY NIGHT ATBEDTIME AS NEEDED FOR SLEEP 60 tablet 1  . gabapentin (NEURONTIN) 300 MG capsule Take 2 capsules (600 mg total) by mouth at bedtime. 120 capsule 6  . glucosamine-chondroitin 500-400 MG tablet Take 1 tablet by mouth 2 (two) times daily.     Marland Kitchen ibuprofen (ADVIL,MOTRIN) 200 MG tablet Take 200 mg by mouth every 8 (eight) hours as needed.    . multivitamin-iron-minerals-folic acid (CENTRUM) chewable tablet Chew 1 tablet by mouth daily.    . naproxen sodium (ALEVE) 220 MG tablet Take 1 tablet (220 mg total) by mouth daily as needed.    . tamoxifen (NOLVADEX) 20 MG tablet Take 1 tablet (20 mg total) by mouth daily. 90  tablet 4  . venlafaxine XR (EFFEXOR-XR) 150 MG 24 hr capsule Take one capsule (150 mg) daily with breakfast starting September 21, 2017 90 capsule 4  . vitamin E 400 UNIT capsule Take 400 Units by mouth daily.     No current facility-administered medications for this visit.     OBJECTIVE: Middle-aged white woman in no acute distress  There were no vitals filed for this visit.   There is no height or weight on [name] to calculate BMI.    ECOG FS:0 - Asymptomatic   LAB RESULTS:  CMP     Component Value Date/Time   NA 141 03/05/2018 1455   NA 143 08/22/2016 0831   K 3.8 03/05/2018 1455   K 4.3 08/22/2016 0831   CL 106 03/05/2018 1455   CO2 27 03/05/2018 1455   CO2 26 08/22/2016 0831   GLUCOSE 91 03/05/2018 1455   GLUCOSE 87 08/22/2016 0831   BUN 12 03/05/2018 1455   BUN 8.7 08/22/2016 0831   CREATININE 0.90 03/05/2018 1455   CREATININE 0.9 08/22/2016 0831   CALCIUM 9.1 03/05/2018 1455   CALCIUM 9.3 08/22/2016 0831   PROT 7.0 03/05/2018 1455   PROT 7.1 08/22/2016 0831   ALBUMIN 4.0 03/05/2018 1455   ALBUMIN 4.3 08/22/2016 0831   AST 33 03/05/2018 1455   AST 29 08/22/2016 0831   ALT 44 03/05/2018 1455   ALT 29 08/22/2016 0831   ALKPHOS 84 03/05/2018 1455   ALKPHOS 85 08/22/2016 0831   BILITOT 0.2 (L) 03/05/2018 1455   BILITOT 0.35 08/22/2016 0831   GFRNONAA >60 03/05/2018 1455   GFRAA >60 03/05/2018 1455    No results found for: TOTALPROTELP, ALBUMINELP, A1GS, A2GS, BETS, BETA2SER, GAMS, MSPIKE, SPEI  No results found for: Nils Pyle, [facility]  Lab Results  Component Value Date   WBC 4.6 03/05/2018   NEUTROABS 2.3 03/05/2018   HGB 13.0 03/05/2018   HCT 40.1 03/05/2018   MCV 93.5 03/05/2018   PLT 173 03/05/2018      Chemistry      Component Value Date/Time   NA 141 03/05/2018 1455   NA 143 08/22/2016 0831   K 3.8 03/05/2018 1455   K 4.3 08/22/2016 0831   CL 106 03/05/2018 1455   CO2 27 03/05/2018 1455   CO2 26 08/22/2016 0831   BUN 12  03/05/2018 1455   BUN 8.7 08/22/2016 0831   CREATININE 0.90 03/05/2018 1455   CREATININE 0.9 08/22/2016 0831      Component Value Date/Time   CALCIUM 9.1 03/05/2018 1455   CALCIUM 9.3 08/22/2016 0831   ALKPHOS 84 03/05/2018 1455   ALKPHOS 85 08/22/2016 0831   AST 33 03/05/2018 1455   AST 29 08/22/2016 0831   ALT 44 03/05/2018 1455   ALT  29 08/22/2016 0831   BILITOT 0.2 (L) 03/05/2018 1455   BILITOT 0.35 08/22/2016 0831       No results found for: LABCA2  No components found for: [id number]  No results for input(s): INR in the last 168 hours.  Urinalysis No results found for: COLORURINE, APPEARANCEUR, LABSPEC, PHURINE, GLUCOSEU, HGBUR, BILIRUBINUR, KETONESUR, PROTEINUR, UROBILINOGEN, NITRITE, LEUKOCYTESUR   STUDIES: No results found.   ELIGIBLE FOR AVAILABLE RESEARCH PROTOCOL: No  ASSESSMENT: 68 y.o. [place] woman status post right breast lower inner quadrant biopsy 08/13/2016 for a clinical T1a N0, stage IA invasive ductal carcinoma, grade 1, estrogen and progesterone receptor positive, HER-2 not amplified, with an MIB-1 of 2%  (1) status post right lumpectomy and sentinel lymph node sampling 09/03/2016 for a pT1b pN0, stage IA invasive ductal carcinoma, grade 1, with a broadly positive anterior margin   (a) margins cleared with additional surgery 09/11/2016  (2) adjuvant radiation6/20/2018 to 11/07/2016 Site/dose:    1. The Right breast was treated to 42.5 Gy in 17 fractions at 2.5 Gy per fraction. 2. The Right breast was boosted to 7.5 Gy in 3 fractions at 2.5 Gy per fraction.  (3) Tamoxifen started 12/25/2016  PLAN: Minerva is now 2 years out from definitive surgery for breast cancer with no evidence of disease recurrence.  This is very favorable.  She is tolerating tamoxifen generally well.  The plan will be to continue that a minimum of 5 years.  Because she was at work with some coworkers present I was not able to ask all the questions that I normally  would have but in general she does appear to be having no significant problems from her treatment  I have renewed her medications I made her a return appointment in 6 months hopefully in person.  She will then follow-up with her primary care physician a year from now  She knows to call for any other issue that may develop before that visit.   Emeli Goguen, Victoria Dad, MD  09/22/18 3:31 PM Medical Oncology and Hematology [facility] [address] [place], [place] [address] Tel. [phone]    Fax. [phone]    I, Victoria [name], am acting as scribe for Dr. Virgie [name]. Stanley Lyness.  I, Lurline Del MD, have reviewed the above documentation for accuracy and completeness, and I agree with the above.

## 2018-09-22 ENCOUNTER — Inpatient Hospital Stay: Payer: BC Managed Care – PPO | Attending: Oncology | Admitting: Oncology

## 2018-09-22 DIAGNOSIS — Z170 Estrogen receptor positive status [ER+]: Secondary | ICD-10-CM | POA: Diagnosis not present

## 2018-09-22 DIAGNOSIS — C50311 Malignant neoplasm of lower-inner quadrant of right female breast: Secondary | ICD-10-CM

## 2018-09-22 DIAGNOSIS — Z79810 Long term (current) use of selective estrogen receptor modulators (SERMs): Secondary | ICD-10-CM

## 2018-09-22 MED ORDER — GABAPENTIN 300 MG PO CAPS: 600.0000 mg | ORAL_CAPSULE | Freq: Every day | ORAL | 6 refills | Status: DC

## 2018-09-22 MED ORDER — TAMOXIFEN CITRATE 20 MG PO TABS: 20.0000 mg | ORAL_TABLET | Freq: Every day | ORAL | 4 refills | Status: DC

## 2018-09-24 ENCOUNTER — Telehealth: Payer: Self-pay | Admitting: Oncology

## 2018-09-24 NOTE — Telephone Encounter (Signed)
I could not reach patient will mail schedule 

## 2018-10-07 ENCOUNTER — Other Ambulatory Visit: Payer: Self-pay | Admitting: Oncology

## 2018-11-11 ENCOUNTER — Other Ambulatory Visit: Payer: Self-pay | Admitting: Oncology

## 2018-11-13 ENCOUNTER — Telehealth: Payer: Self-pay

## 2018-11-13 NOTE — Telephone Encounter (Signed)
Per NP, Clonazepam 0.5 mg called in to [facility] for patient.  Dispensed 60 tablets, no refills.

## 2018-12-09 ENCOUNTER — Telehealth: Payer: Self-pay | Admitting: *Deleted

## 2018-12-09 NOTE — Telephone Encounter (Signed)
Received call from pt stating her PCP has started her on Zoloft for depression.  Pt states she has been taking Zoloft x 1 week and just found out that it interacts with Tamoxifen.  Pt wanting to stay on Zoloft because it is helping to improve her depression and is wanting Dr. Virgie Dad opinion on switching anti-estrogen therapy. RN will discuss with Dr. Jana Hakim and let pt know what other options are available.

## 2018-12-11 ENCOUNTER — Telehealth: Payer: Self-pay | Admitting: *Deleted

## 2018-12-11 NOTE — Telephone Encounter (Signed)
This RN informed pt MD ok with use of tamoxifen and sertraline per pt concern per pharmacy of " drugs may interact ".  Pt verbalized understanding.

## 2018-12-22 ENCOUNTER — Other Ambulatory Visit: Payer: Self-pay | Admitting: Adult Health

## 2018-12-22 NOTE — Telephone Encounter (Signed)
Please refill if you are agreeable

## 2019-01-19 ENCOUNTER — Other Ambulatory Visit: Payer: Self-pay | Admitting: Oncology

## 2019-01-20 ENCOUNTER — Other Ambulatory Visit: Payer: Self-pay | Admitting: Oncology

## 2019-02-24 ENCOUNTER — Other Ambulatory Visit: Payer: Self-pay | Admitting: Oncology

## 2019-02-25 ENCOUNTER — Telehealth: Payer: Self-pay | Admitting: Oncology

## 2019-02-25 NOTE — Telephone Encounter (Signed)
Patient called to reschedule 12/01 appointment, informed patient I will need further assistance from providers nurse and that she will get a call back.

## 2019-03-11 NOTE — Patient Instructions (Addendum)
DUE TO COVID-19 ONLY ONE VISITOR IS ALLOWED TO COME WITH YOU AND STAY IN THE WAITING ROOM ONLY DURING PRE OP AND PROCEDURE DAY OF SURGERY. THE 1 VISITOR MAY VISIT WITH YOU AFTER SURGERY IN YOUR PRIVATE ROOM DURING VISITING HOURS ONLY!  YOU NEED TO HAVE A COVID 19 TEST ON 03-12-19 @ 10:30 AM. THIS TEST MUST BE DONE BEFORE SURGERY, COME  [address], [place] , [id number].  ([place]) ONCE YOUR COVID TEST IS COMPLETED, PLEASE BEGIN THE QUARANTINE INSTRUCTIONS AS OUTLINED IN YOUR HANDOUT.                Victoria [name]  03/11/2019   Your procedure is scheduled on: 03-16-19    Report to [facility] Main  Entrance    Report to admitting at 5:50 AM     Call this number if you have problems the morning of surgery [phone]    NO SOLID FOOD AFTER MIDNIGHT THE NIGHT PRIOR TO SURGERY. YOU MAY DRINK CLEAR LIQUIDS.   STOP CLEAR LIQUIDS AT 5:20 AM AND THEN DRINK THE ENSURE PRE-SURGERY DRINK. NOTHING BY MOUTH AFTER THE ENSURE DRINK!    CLEAR LIQUID DIET   Foods Allowed                                                                     Foods Excluded  Coffee and tea, regular and decaf                             liquids that you cannot  Plain Jell-O any favor except red or purple                                           see through such as: Fruit ices (not with fruit pulp)                                     milk, soups, orange juice  Iced Popsicles                                    All solid food Carbonated beverages, regular and diet                                    Cranberry, grape and apple juices Sports drinks like Gatorade Lightly seasoned clear broth or consume(fat free) Sugar, honey syrup  Sample Menu Breakfast                                Lunch                                     Supper Cranberry juice  Beef broth                            Chicken broth Jell-O                                     Grape juice                            Apple juice Coffee or tea                        Jell-O                                      Popsicle                                                Coffee or tea                        Coffee or tea  _____________________________________________________________________   BRUSH YOUR TEETH MORNING OF SURGERY AND RINSE YOUR MOUTH OUT, NO CHEWING GUM CANDY OR MINTS.     Take these medicines the morning of surgery with A SIP OF WATER: Sertraline(Zoloft)                                  You may not have any metal on your body including hair pins and              piercings     Do not wear jewelry, make-up, lotions, powders or perfumes, deodorant             Do not wear nail polish on your fingernails.  Do not shave  48 hours prior to surgery.     Do not bring valuables to the hospital. [place].  Contacts, dentures or bridgework may not be worn into surgery.  Leave suitcase in the car. After surgery it may be brought to your room.                 Please read over the following fact sheets you were given: _____________________________________________________________________             [facility] - Preparing for Surgery Before surgery, you can play an important role.  Because skin is not sterile, your skin needs to be as free of germs as possible.  You can reduce the number of germs on your skin by washing with CHG (chlorahexidine gluconate) soap before surgery.  CHG is an antiseptic cleaner which kills germs and bonds with the skin to continue killing germs even after washing. Please DO NOT use if you have an allergy to CHG or antibacterial soaps.  If your skin becomes reddened/irritated stop using the CHG and inform your nurse when you arrive at Short Stay. Do not shave (including legs and underarms) for at least 48 hours prior to the  first CHG shower.  You may shave your face/neck. Please follow these instructions  carefully:  1.  Shower with CHG Soap the night before surgery and the  morning of Surgery.  2.  If you choose to wash your hair, wash your hair first as usual with your  normal  shampoo.  3.  After you shampoo, rinse your hair and body thoroughly to remove the  shampoo.                           4.  Use CHG as you would any other liquid soap.  You can apply chg directly  to the skin and wash                       Gently with a scrungie or clean washcloth.  5.  Apply the CHG Soap to your body ONLY FROM THE NECK DOWN.   Do not use on face/ open                           Wound or open sores. Avoid contact with eyes, ears mouth and genitals (private parts).                       Wash face,  Genitals (private parts) with your normal soap.             6.  Wash thoroughly, paying special attention to the area where your surgery  will be performed.  7.  Thoroughly rinse your body with warm water from the neck down.  8.  DO NOT shower/wash with your normal soap after using and rinsing off  the CHG Soap.                9.  Pat yourself dry with a clean towel.            10.  Wear clean pajamas.            11.  Place clean sheets on your bed the night of your first shower and do not  sleep with pets. Day of Surgery : Do not apply any lotions/deodorants the morning of surgery.  Please wear clean clothes to the hospital/surgery center.  FAILURE TO FOLLOW THESE INSTRUCTIONS MAY RESULT IN THE CANCELLATION OF YOUR SURGERY PATIENT SIGNATURE_________________________________  NURSE SIGNATURE__________________________________  ________________________________________________________________________   Victoria [name]  An incentive spirometer is a tool that can help keep your lungs clear and active. This tool measures how well you are filling your lungs with each breath. Taking long deep breaths may help reverse or decrease the chance of developing breathing (pulmonary) problems (especially infection)  following:  A long period of time when you are unable to move or be active. BEFORE THE PROCEDURE   If the spirometer includes an indicator to show your best effort, your nurse or respiratory therapist will set it to a desired goal.  If possible, sit up straight or lean slightly forward. Try not to slouch.  Hold the incentive spirometer in an upright position. INSTRUCTIONS FOR USE  1. Sit on the edge of your bed if possible, or sit up as far as you can in bed or on a chair. 2. Hold the incentive spirometer in an upright position. 3. Breathe out normally. 4. Place the mouthpiece in your mouth and seal your lips tightly around it. 5. Breathe in slowly and  as deeply as possible, raising the piston or the ball toward the top of the column. 6. Hold your breath for 3-5 seconds or for as long as possible. Allow the piston or ball to fall to the bottom of the column. 7. Remove the mouthpiece from your mouth and breathe out normally. 8. Rest for a few seconds and repeat Steps 1 through 7 at least 10 times every 1-2 hours when you are awake. Take your time and take a few normal breaths between deep breaths. 9. The spirometer may include an indicator to show your best effort. Use the indicator as a goal to work toward during each repetition. 10. After each set of 10 deep breaths, practice coughing to be sure your lungs are clear. If you have an incision (the cut made at the time of surgery), support your incision when coughing by placing a pillow or rolled up towels firmly against it. Once you are able to get out of bed, walk around indoors and cough well. You may stop using the incentive spirometer when instructed by your caregiver.  RISKS AND COMPLICATIONS  Take your time so you do not get dizzy or light-headed.  If you are in pain, you may need to take or ask for pain medication before doing incentive spirometry. It is harder to take a deep breath if you are having pain. AFTER USE  Rest and  breathe slowly and easily.  It can be helpful to keep track of a log of your progress. Your caregiver can provide you with a simple table to help with this. If you are using the spirometer at home, follow these instructions: [place] IF:   You are having difficultly using the spirometer.  You have trouble using the spirometer as often as instructed.  Your pain medication is not giving enough relief while using the spirometer.  You develop fever of 100.5 F (38.1 C) or higher. SEEK IMMEDIATE MEDICAL CARE IF:   You cough up bloody sputum that had not been present before.  You develop fever of 102 F (38.9 C) or greater.  You develop worsening pain at or near the incision site. MAKE SURE YOU:   Understand these instructions.  Will watch your condition.  Will get help right away if you are not doing well or get worse. Document Released: 08/20/2006 Document Revised: 07/02/2011 Document Reviewed: 10/21/2006 Exit[employer] Patient Information 2014 Exit[employer], [place].   ________________________________________________________________________  WHAT IS A BLOOD TRANSFUSION? Blood Transfusion Information  A transfusion is the replacement of blood or some of its parts. Blood is made up of multiple cells which provide different functions.  Red blood cells carry oxygen and are used for blood loss replacement.  White blood cells fight against infection.  Platelets control bleeding.  Plasma helps clot blood.  Other blood products are available for specialized needs, such as hemophilia or other clotting disorders. BEFORE THE TRANSFUSION  Who gives blood for transfusions?   Healthy volunteers who are fully evaluated to make sure their blood is safe. This is blood bank blood. Transfusion therapy is the safest it has ever been in the practice of medicine. Before blood is taken from a donor, a complete history is taken to make sure that person has no history of diseases nor engages in  risky social behavior (examples are intravenous drug use or sexual activity with multiple partners). The donor's travel history is screened to minimize risk of transmitting infections, such as malaria. The donated blood is tested for signs of  infectious diseases, such as HIV and hepatitis. The blood is then tested to be sure it is compatible with you in order to minimize the chance of a transfusion reaction. If you or a relative donates blood, this is often done in anticipation of surgery and is not appropriate for emergency situations. It takes many days to process the donated blood. RISKS AND COMPLICATIONS Although transfusion therapy is very safe and saves many lives, the main dangers of transfusion include:   Getting an infectious disease.  Developing a transfusion reaction. This is an allergic reaction to something in the blood you were given. Every precaution is taken to prevent this. The decision to have a blood transfusion has been considered carefully by your caregiver before blood is given. Blood is not given unless the benefits outweigh the risks. AFTER THE TRANSFUSION  Right after receiving a blood transfusion, you will usually feel much better and more energetic. This is especially true if your red blood cells have gotten low (anemic). The transfusion raises the level of the red blood cells which carry oxygen, and this usually causes an energy increase.  The nurse administering the transfusion will monitor you carefully for complications. HOME CARE INSTRUCTIONS  No special instructions are needed after a transfusion. You may find your energy is better. Speak with your caregiver about any limitations on activity for underlying diseases you may have. SEEK MEDICAL CARE IF:   Your condition is not improving after your transfusion.  You develop redness or irritation at the intravenous (IV) site. SEEK IMMEDIATE MEDICAL CARE IF:  Any of the following symptoms occur over the next 12  hours:  Shaking chills.  You have a temperature by mouth above 102 F (38.9 C), not controlled by medicine.  Chest, back, or muscle pain.  People around you feel you are not acting correctly or are confused.  Shortness of breath or difficulty breathing.  Dizziness and fainting.  You get a rash or develop hives.  You have a decrease in urine output.  Your urine turns a dark color or changes to pink, red, or brown. Any of the following symptoms occur over the next 10 days:  You have a temperature by mouth above 102 F (38.9 C), not controlled by medicine.  Shortness of breath.  Weakness after normal activity.  The white part of the eye turns yellow (jaundice).  You have a decrease in the amount of urine or are urinating less often.  Your urine turns a dark color or changes to pink, red, or brown. Document Released: 04/06/2000 Document Revised: 07/02/2011 Document Reviewed: 11/24/2007 [facility] Patient Information 2014 [place], [place].  _______________________________________________________________________

## 2019-03-11 NOTE — H&P (Signed)
TOTAL KNEE ADMISSION H&P  Patient is being admitted for right total knee arthroplasty.  Subjective:  Chief Complaint:right knee pain.  HPI: Victoria [name], 68 y.o. female, has a history of pain and functional disability in the right knee due to arthritis and has failed non-surgical conservative treatments for greater than 12 weeks to includeNSAID's and/or analgesics, corticosteriod injections, viscosupplementation injections, flexibility and strengthening excercises and activity modification.  Onset of symptoms was gradual, starting 5 years ago with gradually worsening course since that time. The patient noted prior procedures on the knee to include  arthroscopy and menisectomy on the right knee(s).  Patient currently rates pain in the right knee(s) at 7 out of 10 with activity. Patient has night pain, worsening of pain with activity and weight bearing, pain that interferes with activities of daily living, pain with passive range of motion, crepitus and joint swelling.  Patient has evidence of periarticular osteophytes and joint space narrowing by imaging studies. There is no active infection.  Patient Active Problem List   Diagnosis Date Noted  . Malignant neoplasm of lower-inner quadrant of right breast of female, estrogen receptor positive ([place]) 08/20/2016   Past Medical History:  Diagnosis Date  . Arthritis    neck  . Cancer ([place]) 07/2016   right breast cancer  . Depression   . Family history of adverse reaction to anesthesia    mother had cardiac arrest and PONV  . Headache    migraine  . History of kidney stones   . No pertinent past medical history     Past Surgical History:  Procedure Laterality Date  . BACK SURGERY     diskectomy L5-6  . BREAST LUMPECTOMY WITH RADIOACTIVE SEED AND SENTINEL LYMPH NODE BIOPSY Right 09/03/2016   Procedure: RIGHT BREAST LUMPECTOMY WITH RADIOACTIVE SEED AND RIGHT AXILLARY SENTINEL NODE BIOPSY;  Surgeon: Rolm Bookbinder, MD;  Location: [facility];  Service: General;  Laterality: Right;  . CESAREAN SECTION     triplets  . DILATION AND CURETTAGE OF UTERUS    . KNEE ARTHROSCOPY Right   . RE-EXCISION OF BREAST LUMPECTOMY Right 09/11/2016   Procedure: RIGHT RE-EXCISION OF BREAST LUMPECTOMY;  Surgeon: Rolm Bookbinder, MD;  Location: [facility];  Service: General;  Laterality: Right;       Current Outpatient Medications  Medication Sig Dispense Refill Last Dose  . clonazePAM (KLONOPIN) 0.5 MG tablet TAKE 1-2 TABLETS BY MOUTH AT BEDTIME AS NEEDED FOR SLEEP (Patient taking differently: Take 0.5-1 mg by mouth at bedtime. ) 60 tablet 0   . gabapentin (NEURONTIN) 300 MG capsule Take 2 capsules (600 mg total) by mouth at bedtime. 120 capsule 6   . sertraline (ZOLOFT) 100 MG tablet Take 150 mg by mouth daily.     . Calcium Carbonate-Vitamin D 600-400 MG-UNIT tablet Take 1 tablet by mouth daily.      . Cholecalciferol (VITAMIN D) 50 MCG (2000 UT) CAPS Take 2,000 Units by mouth daily.      Marland Kitchen glucosamine-chondroitin 500-400 MG tablet Take 1 tablet by mouth 2 (two) times daily.      Marland Kitchen ibuprofen (ADVIL,MOTRIN) 200 MG tablet Take 400 mg by mouth every 8 (eight) hours as needed (pain).      . multivitamin-iron-minerals-folic acid (CENTRUM) chewable tablet Chew 1 tablet by mouth daily.     . naproxen sodium (ALEVE) 220 MG tablet Take 440 mg by mouth daily as needed (pain).      . tamoxifen (NOLVADEX) 20 MG tablet Take 1 tablet (  20 mg total) by mouth daily. 90 tablet 4   . vitamin E 400 UNIT capsule Take 400 Units by mouth daily.      Allergies  Allergen Reactions  . Darvon [Propoxyphene Hcl] Other (See Comments)    hyperactivity  . Penicillins Itching    Has patient had a PCN reaction causing immediate rash, facial/tongue/throat swelling, SOB or lightheadedness with hypotension: No Has patient had a PCN reaction causing severe rash involving mucus membranes or skin necrosis: No Has patient had a PCN reaction that required  hospitalization: No Has patient had a PCN reaction occurring within the last 10 years: No If all of the above answers are "NO", then may proceed with Cephalosporin use.     Social History   Tobacco Use  . Smoking status: Never Smoker  . Smokeless tobacco: Never Used  Substance Use Topics  . Alcohol use: Yes    Comment: occ    Family History  Problem Relation Age of Onset  . Breast cancer Paternal Aunt   . Breast cancer Cousin      Review of Systems  Constitutional: Negative.   HENT: Negative.   Eyes: Negative.   Respiratory: Negative.   Cardiovascular: Negative.   Gastrointestinal: Negative.   Genitourinary: Negative.   Musculoskeletal: Positive for joint pain and myalgias. Negative for back pain, falls and neck pain.  Skin: Negative.   Neurological: Negative.   Endo/Heme/Allergies: Negative.   Psychiatric/Behavioral: Negative.     Objective:  Physical Exam  Constitutional: She is oriented to person, place, and time. She appears well-developed and well-nourished. No distress.  HENT:  Head: Normocephalic and atraumatic.  Right Ear: External ear normal.  Left Ear: External ear normal.  Nose: Nose normal.  Mouth/Throat: Oropharynx is clear and moist.  Eyes: Conjunctivae and EOM are normal.  Neck: Normal range of motion. Neck supple.  Cardiovascular: Normal rate, regular rhythm, normal heart sounds and intact distal pulses.  No murmur heard. Respiratory: Effort normal and breath sounds normal. No respiratory distress. She has no wheezes.  GI: Soft. Bowel sounds are normal. She exhibits no distension. There is no abdominal tenderness.  Musculoskeletal:     Comments: Antalgic gait pattern favoring the right side without using assisted devices.  Right Hip Exam: ROM: Normal without discomfort. There is no tenderness over the greater trochanter bursa.  Right Knee Exam: Baker's cyst.  Slight varus deformity. No effusion. No Swelling. Range of motion is 0 to 125  degrees. Moderate crepitus on range of motion of the knee. Positive medial joint line tenderness No lateral joint line tenderness. Stable knee.  Left Hip Exam: ROM: Normal without discomfort. There is no tenderness over the greater trochanter bursa.  Left Knee Exam: No effusion. No Swelling. Range of motion is 0-125 degrees. No crepitus on range of motion of the knee. No medial joint line tenderness No lateral joint line tenderness. Stable knee.  Neurological: She is alert and oriented to person, place, and time. She has normal strength. No sensory deficit.  Skin: She is not diaphoretic.  Psychiatric: She has a normal mood and affect. Her behavior is normal.    Vitals Ht: 5 ft 4 in  Wt: 147 lbs  BMI: 25.2  BP: 112/76 sitting L arm  Pulse: 76 bpm regular  Imaging Review Plain radiographs demonstrate severe degenerative joint disease of the right knee(s). The overall alignment ismild varus. The bone quality appears to be good for age and reported activity level.    Assessment/Plan:  End stage  primary osteoarthritis, right knee   The patient history, physical examination, clinical judgment of the provider and imaging studies are consistent with end stage degenerative joint disease of the right knee(s) and total knee arthroplasty is deemed medically necessary. The treatment options including medical management, injection therapy arthroscopy and arthroplasty were discussed at length. The risks and benefits of total knee arthroplasty were presented and reviewed. The risks due to aseptic loosening, infection, stiffness, patella tracking problems, thromboembolic complications and other imponderables were discussed. The patient acknowledged the explanation, agreed to proceed with the plan and consent was signed. Patient is being admitted for inpatient treatment for surgery, pain control, PT, OT, prophylactic antibiotics, VTE prophylaxis, progressive ambulation and ADL's and discharge  planning. The patient is planning to be discharged home.     Anticipated LOS equal to or greater than 2 midnights due to - Age 27 and older with one or more of the following:  - Obesity  - Expected need for hospital services (PT, OT, Nursing) required for safe  discharge  - Anticipated need for postoperative skilled nursing care or inpatient rehab  - Active co-morbidities: None OR   - Unanticipated findings during/Post Surgery: None  - Patient is a high risk of re-admission due to: None    Risks and benefits of the surgery were discussed with the patient and Dr.Aluisio at their previous office visit, and the patient has elected to move forward with the aforementioned surgery. Post-operative care plans were discussed with the patient today.  Therapy Plans: outpatient therapy Disposition: Home with husband and daughter Planned DVT prophylaxis: Xarelto 10mg  daily (hx of breast cancer) DME needed: has equipment PCP: Cari Caraway- clearance received  Other: no anesthesia concerns  Instructed patient on meds to stop prior to surgery  Ardeen Jourdain, PA-C

## 2019-03-12 ENCOUNTER — Other Ambulatory Visit (HOSPITAL_COMMUNITY)
Admission: RE | Admit: 2019-03-12 | Discharge: 2019-03-12 | Disposition: A | Discharge status: Home / Self Care | Payer: BC Managed Care – PPO | Source: Ambulatory Visit | Attending: Orthopedic Surgery | Admitting: Orthopedic Surgery

## 2019-03-12 ENCOUNTER — Encounter (HOSPITAL_COMMUNITY)
Admission: RE | Admit: 2019-03-12 | Discharge: 2019-03-12 | Disposition: A | Discharge status: Home / Self Care | Payer: BC Managed Care – PPO | Source: Ambulatory Visit | Attending: Orthopedic Surgery | Admitting: Orthopedic Surgery

## 2019-03-12 ENCOUNTER — Encounter (HOSPITAL_COMMUNITY): Payer: Self-pay

## 2019-03-12 ENCOUNTER — Other Ambulatory Visit: Payer: Self-pay

## 2019-03-12 DIAGNOSIS — Z01818 Encounter for other preprocedural examination: Secondary | ICD-10-CM | POA: Insufficient documentation

## 2019-03-12 DIAGNOSIS — I444 Left anterior fascicular block: Secondary | ICD-10-CM | POA: Insufficient documentation

## 2019-03-12 DIAGNOSIS — Z20828 Contact with and (suspected) exposure to other viral communicable diseases: Secondary | ICD-10-CM | POA: Diagnosis not present

## 2019-03-12 DIAGNOSIS — R9431 Abnormal electrocardiogram [ECG] [EKG]: Secondary | ICD-10-CM | POA: Insufficient documentation

## 2019-03-12 DIAGNOSIS — M1711 Unilateral primary osteoarthritis, right knee: Secondary | ICD-10-CM | POA: Diagnosis not present

## 2019-03-12 LAB — COMPREHENSIVE METABOLIC PANEL
ALT: 27 U/L (ref 0–44)
AST: 28 U/L (ref 15–41)
Albumin: 4.2 g/dL (ref 3.5–5.0)
Alkaline Phosphatase: 61 U/L (ref 38–126)
Anion gap: 11 (ref 5–15)
BUN: 9 mg/dL (ref 8–23)
CO2: 24 mmol/L (ref 22–32)
Calcium: 8.9 mg/dL (ref 8.9–10.3)
Chloride: 103 mmol/L (ref 98–111)
Creatinine, Ser: 0.74 mg/dL (ref 0.44–1.00)
GFR calc Af Amer: >60 mL/min (ref >60)
GFR calc non Af Amer: >60 mL/min (ref >60)
Glucose, Bld: 88 mg/dL (ref 70–99)
Potassium: 4.1 mmol/L (ref 3.5–5.1)
Sodium: 138 mmol/L (ref 135–145)
Total Bilirubin: 0.4 mg/dL (ref 0.3–1.2)
Total Protein: 6.9 g/dL (ref 6.5–8.1)

## 2019-03-12 LAB — CBC WITH DIFFERENTIAL/PLATELET
Abs Immature Granulocytes: 0 10*3/uL (ref 0.00–0.07)
Basophils Absolute: 0.1 10*3/uL (ref 0.0–0.1)
Basophils Relative: 1 %
Eosinophils Absolute: 0.1 10*3/uL (ref 0.0–0.5)
Eosinophils Relative: 1 %
HCT: 39 % (ref 36.0–46.0)
Hemoglobin: 12.2 g/dL (ref 12.0–15.0)
Immature Granulocytes: 0 %
Lymphocytes Relative: 39 %
Lymphs Abs: 1.8 10*3/uL (ref 0.7–4.0)
MCH: 29.1 pg (ref 26.0–34.0)
MCHC: 31.3 g/dL (ref 30.0–36.0)
MCV: 93.1 fL (ref 80.0–100.0)
Monocytes Absolute: 0.5 10*3/uL (ref 0.1–1.0)
Monocytes Relative: 11 %
Neutro Abs: 2.1 10*3/uL (ref 1.7–7.7)
Neutrophils Relative %: 48 %
Platelets: 194 10*3/uL (ref 150–400)
RBC: 4.19 MIL/uL (ref 3.87–5.11)
RDW: 12.7 % (ref 11.5–15.5)
WBC: 4.5 10*3/uL (ref 4.0–10.5)
nRBC: 0 % (ref 0.0–0.2)

## 2019-03-12 LAB — PROTIME-INR
INR: 1 (ref 0.8–1.2)
Prothrombin Time: 13.1 seconds (ref 11.4–15.2)

## 2019-03-12 LAB — SURGICAL PCR SCREEN
MRSA, PCR: NEGATIVE
Staphylococcus aureus: POSITIVE — AB

## 2019-03-12 LAB — APTT: aPTT: 31 seconds (ref 24–36)

## 2019-03-12 LAB — ABO/RH: ABO/RH(D): O POS

## 2019-03-12 NOTE — Progress Notes (Signed)
PCR results routed to Dr. Wynelle Link for review.

## 2019-03-13 LAB — NOVEL CORONAVIRUS, NAA (HOSP ORDER, SEND-OUT TO REF LAB; TAT 18-24 HRS): SARS-CoV-2, NAA: NOT DETECTED

## 2019-03-13 NOTE — Anesthesia Preprocedure Evaluation (Addendum)
Anesthesia Evaluation  Patient identified by MRN, date of birth, ID band Patient awake    Reviewed: Allergy & Precautions, NPO status , Patient's Chart, lab work & pertinent test results  Airway Mallampati: II  TM Distance: >3 FB Neck ROM: Full    Dental no notable dental hx. (+) Dental Advisory Given   Pulmonary neg pulmonary ROS,    Pulmonary exam normal        Cardiovascular Exercise Tolerance: Good negative cardio ROS Normal cardiovascular exam     Neuro/Psych PSYCHIATRIC DISORDERS Depression negative neurological ROS     GI/Hepatic negative GI ROS, Neg liver ROS,   Endo/Other  negative endocrine ROS  Renal/GU negative Renal ROS     Musculoskeletal  (+) Arthritis , Osteoarthritis,    Abdominal   Peds  Hematology negative hematology ROS (+)   Anesthesia Other Findings Day of surgery medications reviewed with the patient.  Right breast cancer  Reproductive/Obstetrics                            Anesthesia Physical  Anesthesia Plan  ASA: II  Anesthesia Plan: Spinal   Post-op Pain Management:  [facility] for Post-op pain   Induction:   PONV Risk Score and Plan: Ondansetron and Propofol infusion  Airway Management Planned: Natural Airway  Additional Equipment:   Intra-op Plan:   Post-operative Plan:   Informed Consent: I have reviewed the patients History and Physical, chart, labs and discussed the procedure including the risks, benefits and alternatives for the proposed anesthesia with the patient or authorized representative who has indicated his/her understanding and acceptance.     Dental advisory given  Plan Discussed with: CRNA and Anesthesiologist  Anesthesia Plan Comments:        Anesthesia Quick Evaluation

## 2019-03-15 MED ORDER — BUPIVACAINE LIPOSOME 1.3 % IJ SUSP
20.0000 mL | Freq: Once | INTRAMUSCULAR | Status: DC
  Filled 2019-03-15: qty 20

## 2019-03-16 ENCOUNTER — Inpatient Hospital Stay (HOSPITAL_COMMUNITY): Payer: BC Managed Care – PPO | Admitting: Anesthesiology

## 2019-03-16 ENCOUNTER — Encounter (HOSPITAL_COMMUNITY): Payer: Self-pay | Admitting: *Deleted

## 2019-03-16 ENCOUNTER — Encounter (HOSPITAL_COMMUNITY)
Admission: RE | Disposition: A | Discharge status: Home / Self Care | Payer: Self-pay | Source: Home / Self Care | Attending: Orthopedic Surgery

## 2019-03-16 ENCOUNTER — Inpatient Hospital Stay (HOSPITAL_COMMUNITY)
Admission: RE | Admit: 2019-03-16 | Discharge: 2019-03-17 | DRG: 470 | Disposition: A | Discharge status: Home / Self Care | Payer: BC Managed Care – PPO | Attending: Orthopedic Surgery | Admitting: Orthopedic Surgery

## 2019-03-16 ENCOUNTER — Inpatient Hospital Stay (HOSPITAL_COMMUNITY): Payer: BC Managed Care – PPO | Admitting: Physician Assistant

## 2019-03-16 ENCOUNTER — Other Ambulatory Visit: Payer: Self-pay

## 2019-03-16 DIAGNOSIS — M1710 Unilateral primary osteoarthritis, unspecified knee: Secondary | ICD-10-CM | POA: Diagnosis present

## 2019-03-16 DIAGNOSIS — Z803 Family history of malignant neoplasm of breast: Secondary | ICD-10-CM

## 2019-03-16 DIAGNOSIS — E669 Obesity, unspecified: Secondary | ICD-10-CM | POA: Diagnosis present

## 2019-03-16 DIAGNOSIS — M179 Osteoarthritis of knee, unspecified: Secondary | ICD-10-CM | POA: Diagnosis present

## 2019-03-16 DIAGNOSIS — Z880 Allergy status to penicillin: Secondary | ICD-10-CM | POA: Diagnosis not present

## 2019-03-16 DIAGNOSIS — Z170 Estrogen receptor positive status [ER+]: Secondary | ICD-10-CM

## 2019-03-16 DIAGNOSIS — G43909 Migraine, unspecified, not intractable, without status migrainosus: Secondary | ICD-10-CM | POA: Diagnosis present

## 2019-03-16 DIAGNOSIS — Z885 Allergy status to narcotic agent status: Secondary | ICD-10-CM | POA: Diagnosis not present

## 2019-03-16 DIAGNOSIS — M1711 Unilateral primary osteoarthritis, right knee: Principal | ICD-10-CM | POA: Diagnosis present

## 2019-03-16 DIAGNOSIS — Z6825 Body mass index (BMI) 25.0-25.9, adult: Secondary | ICD-10-CM

## 2019-03-16 DIAGNOSIS — Z853 Personal history of malignant neoplasm of breast: Secondary | ICD-10-CM | POA: Diagnosis not present

## 2019-03-16 HISTORY — PX: TOTAL KNEE ARTHROPLASTY: SHX125

## 2019-03-16 LAB — TYPE AND SCREEN
ABO/RH(D): O POS
Antibody Screen: NEGATIVE

## 2019-03-16 SURGERY — ARTHROPLASTY, KNEE, TOTAL
Anesthesia: Spinal | Site: Knee | Laterality: Right

## 2019-03-16 MED ORDER — BISACODYL 10 MG RE SUPP: 10.0000 mg | Freq: Every day | RECTAL | Status: DC | PRN

## 2019-03-16 MED ORDER — CHLORHEXIDINE GLUCONATE 4 % EX LIQD: 60.0000 mL | Freq: Once | CUTANEOUS | Status: DC

## 2019-03-16 MED ORDER — SODIUM CHLORIDE 0.9 % IV SOLN
INTRAVENOUS | Status: DC
  Administered 2019-03-16: 13:00:00 via INTRAVENOUS

## 2019-03-16 MED ORDER — FENTANYL CITRATE (PF) 100 MCG/2ML IJ SOLN: 25.0000 ug | INTRAMUSCULAR | Status: DC | PRN

## 2019-03-16 MED ORDER — LIDOCAINE 2% (20 MG/ML) 5 ML SYRINGE
INTRAMUSCULAR | Status: AC
  Filled 2019-03-16: qty 5

## 2019-03-16 MED ORDER — PROMETHAZINE HCL 25 MG/ML IJ SOLN: 6.2500 mg | INTRAMUSCULAR | Status: DC | PRN

## 2019-03-16 MED ORDER — EPHEDRINE 5 MG/ML INJ
INTRAVENOUS | Status: AC
  Filled 2019-03-16: qty 10

## 2019-03-16 MED ORDER — DEXAMETHASONE SODIUM PHOSPHATE 10 MG/ML IJ SOLN
8.0000 mg | Freq: Once | INTRAMUSCULAR | Status: AC
  Administered 2019-03-16: 09:00:00 8 mg via INTRAVENOUS

## 2019-03-16 MED ORDER — BUPIVACAINE IN DEXTROSE 0.75-8.25 % IT SOLN
INTRATHECAL | Status: DC | PRN
  Administered 2019-03-16: 11 mg via INTRATHECAL

## 2019-03-16 MED ORDER — CLONAZEPAM 0.5 MG PO TABS
0.5000 mg | ORAL_TABLET | Freq: Every day | ORAL | Status: DC
  Administered 2019-03-16: 0.75 mg via ORAL
  Filled 2019-03-16: qty 2

## 2019-03-16 MED ORDER — SODIUM CHLORIDE (PF) 0.9 % IJ SOLN
INTRAMUSCULAR | Status: AC
  Filled 2019-03-16: qty 10

## 2019-03-16 MED ORDER — GABAPENTIN 300 MG PO CAPS
600.0000 mg | ORAL_CAPSULE | Freq: Every day | ORAL | Status: DC
  Administered 2019-03-16: 600 mg via ORAL
  Filled 2019-03-16: qty 2

## 2019-03-16 MED ORDER — MIDAZOLAM HCL 2 MG/2ML IJ SOLN
1.0000 mg | INTRAMUSCULAR | Status: DC
  Filled 2019-03-16: qty 2

## 2019-03-16 MED ORDER — MIDAZOLAM HCL 5 MG/5ML IJ SOLN
INTRAMUSCULAR | Status: DC | PRN
  Administered 2019-03-16 (×2): 0.5 mg via INTRAVENOUS
  Administered 2019-03-16: 1 mg via INTRAVENOUS

## 2019-03-16 MED ORDER — ROPIVACAINE HCL 7.5 MG/ML IJ SOLN
INTRAMUSCULAR | Status: DC | PRN
  Administered 2019-03-16: 20 mL via PERINEURAL

## 2019-03-16 MED ORDER — TAMOXIFEN CITRATE 10 MG PO TABS
20.0000 mg | ORAL_TABLET | Freq: Every day | ORAL | Status: DC
  Administered 2019-03-17: 20 mg via ORAL
  Filled 2019-03-16: qty 2

## 2019-03-16 MED ORDER — RIVAROXABAN 10 MG PO TABS
10.0000 mg | ORAL_TABLET | Freq: Every day | ORAL | Status: DC
  Administered 2019-03-17: 10 mg via ORAL
  Filled 2019-03-16: qty 1

## 2019-03-16 MED ORDER — PROPOFOL 500 MG/50ML IV EMUL
INTRAVENOUS | Status: AC
  Filled 2019-03-16: qty 50

## 2019-03-16 MED ORDER — OXYCODONE HCL 5 MG PO TABS
5.0000 mg | ORAL_TABLET | ORAL | Status: DC | PRN
  Administered 2019-03-16: 5 mg via ORAL
  Filled 2019-03-16: qty 1

## 2019-03-16 MED ORDER — DEXAMETHASONE SODIUM PHOSPHATE 10 MG/ML IJ SOLN
INTRAMUSCULAR | Status: AC
  Filled 2019-03-16: qty 1

## 2019-03-16 MED ORDER — PHENYLEPHRINE HCL (PRESSORS) 10 MG/ML IV SOLN
INTRAVENOUS | Status: DC | PRN
  Administered 2019-03-16 (×3): 80 ug via INTRAVENOUS
  Administered 2019-03-16: 40 ug via INTRAVENOUS

## 2019-03-16 MED ORDER — MENTHOL 3 MG MT LOZG: 1.0000 | LOZENGE | OROMUCOSAL | Status: DC | PRN

## 2019-03-16 MED ORDER — ACETAMINOPHEN 500 MG PO TABS
1000.0000 mg | ORAL_TABLET | Freq: Four times a day (QID) | ORAL | Status: AC
  Administered 2019-03-16 – 2019-03-17 (×3): 1000 mg via ORAL
  Filled 2019-03-16 (×4): qty 2

## 2019-03-16 MED ORDER — FENTANYL CITRATE (PF) 100 MCG/2ML IJ SOLN
INTRAMUSCULAR | Status: DC | PRN
  Administered 2019-03-16 (×2): 50 ug via INTRAVENOUS

## 2019-03-16 MED ORDER — ONDANSETRON HCL 4 MG/2ML IJ SOLN
INTRAMUSCULAR | Status: DC | PRN
  Administered 2019-03-16: 4 mg via INTRAVENOUS

## 2019-03-16 MED ORDER — METOCLOPRAMIDE HCL 5 MG PO TABS: 5.0000 mg | ORAL_TABLET | Freq: Three times a day (TID) | ORAL | Status: DC | PRN

## 2019-03-16 MED ORDER — ONDANSETRON HCL 4 MG/2ML IJ SOLN: 4.0000 mg | Freq: Four times a day (QID) | INTRAMUSCULAR | Status: DC | PRN

## 2019-03-16 MED ORDER — CEFAZOLIN SODIUM-DEXTROSE 2-4 GM/100ML-% IV SOLN: 2.0000 g | Freq: Once | INTRAVENOUS | Status: DC

## 2019-03-16 MED ORDER — DOCUSATE SODIUM 100 MG PO CAPS
100.0000 mg | ORAL_CAPSULE | Freq: Two times a day (BID) | ORAL | Status: DC
  Administered 2019-03-16 – 2019-03-17 (×2): 100 mg via ORAL
  Filled 2019-03-16 (×2): qty 1

## 2019-03-16 MED ORDER — ACETAMINOPHEN 10 MG/ML IV SOLN
1000.0000 mg | Freq: Four times a day (QID) | INTRAVENOUS | Status: DC
  Administered 2019-03-16: 09:00:00 1000 mg via INTRAVENOUS
  Filled 2019-03-16: qty 100

## 2019-03-16 MED ORDER — METHOCARBAMOL 500 MG IVPB - SIMPLE MED
500.0000 mg | Freq: Four times a day (QID) | INTRAVENOUS | Status: DC | PRN
  Filled 2019-03-16: qty 50

## 2019-03-16 MED ORDER — OXYCODONE HCL 5 MG PO TABS
10.0000 mg | ORAL_TABLET | ORAL | Status: DC | PRN
  Administered 2019-03-16 (×2): 10 mg via ORAL
  Administered 2019-03-17: 15 mg via ORAL
  Administered 2019-03-17: 01:00:00 10 mg via ORAL
  Administered 2019-03-17: 15 mg via ORAL
  Filled 2019-03-16 (×2): qty 3
  Filled 2019-03-16 (×3): qty 2

## 2019-03-16 MED ORDER — ONDANSETRON HCL 4 MG/2ML IJ SOLN
INTRAMUSCULAR | Status: AC
  Filled 2019-03-16: qty 2

## 2019-03-16 MED ORDER — POVIDONE-IODINE 10 % EX SWAB
2.0000 "application " | Freq: Once | CUTANEOUS | Status: AC
  Administered 2019-03-16: 2 via TOPICAL

## 2019-03-16 MED ORDER — DEXAMETHASONE SODIUM PHOSPHATE 10 MG/ML IJ SOLN
10.0000 mg | Freq: Once | INTRAMUSCULAR | Status: AC
  Administered 2019-03-17: 10 mg via INTRAVENOUS
  Filled 2019-03-16: qty 1

## 2019-03-16 MED ORDER — PROPOFOL 500 MG/50ML IV EMUL
INTRAVENOUS | Status: DC | PRN
  Administered 2019-03-16: 50 ug/kg/min via INTRAVENOUS

## 2019-03-16 MED ORDER — BUPIVACAINE LIPOSOME 1.3 % IJ SUSP
INTRAMUSCULAR | Status: DC | PRN
  Administered 2019-03-16: 80 mL

## 2019-03-16 MED ORDER — SODIUM CHLORIDE (PF) 0.9 % IJ SOLN
INTRAMUSCULAR | Status: AC
  Filled 2019-03-16: qty 50

## 2019-03-16 MED ORDER — LACTATED RINGERS IV SOLN
INTRAVENOUS | Status: DC
  Administered 2019-03-16 (×2): via INTRAVENOUS

## 2019-03-16 MED ORDER — EPHEDRINE SULFATE 50 MG/ML IJ SOLN
INTRAMUSCULAR | Status: DC | PRN
  Administered 2019-03-16: 10 mg via INTRAVENOUS
  Administered 2019-03-16: 5 mg via INTRAVENOUS

## 2019-03-16 MED ORDER — METOCLOPRAMIDE HCL 5 MG/ML IJ SOLN: 5.0000 mg | Freq: Three times a day (TID) | INTRAMUSCULAR | Status: DC | PRN

## 2019-03-16 MED ORDER — CEFAZOLIN SODIUM-DEXTROSE 2-3 GM-%(50ML) IV SOLR
INTRAVENOUS | Status: DC | PRN
  Administered 2019-03-16: 2 g via INTRAVENOUS

## 2019-03-16 MED ORDER — METHOCARBAMOL 500 MG PO TABS
500.0000 mg | ORAL_TABLET | Freq: Four times a day (QID) | ORAL | Status: DC | PRN
  Administered 2019-03-16 (×2): 500 mg via ORAL
  Filled 2019-03-16 (×2): qty 1

## 2019-03-16 MED ORDER — SODIUM CHLORIDE (PF) 0.9 % IJ SOLN
INTRAMUSCULAR | Status: DC | PRN
  Administered 2019-03-16: 60 mL

## 2019-03-16 MED ORDER — CEFAZOLIN SODIUM-DEXTROSE 2-4 GM/100ML-% IV SOLN
2.0000 g | Freq: Four times a day (QID) | INTRAVENOUS | Status: AC
  Administered 2019-03-16 (×2): 2 g via INTRAVENOUS
  Filled 2019-03-16 (×2): qty 100

## 2019-03-16 MED ORDER — PHENYLEPHRINE 40 MCG/ML (10ML) SYRINGE FOR IV PUSH (FOR BLOOD PRESSURE SUPPORT)
PREFILLED_SYRINGE | INTRAVENOUS | Status: AC
  Filled 2019-03-16: qty 10

## 2019-03-16 MED ORDER — FENTANYL CITRATE (PF) 100 MCG/2ML IJ SOLN
INTRAMUSCULAR | Status: AC
  Filled 2019-03-16: qty 2

## 2019-03-16 MED ORDER — MORPHINE SULFATE (PF) 2 MG/ML IV SOLN
1.0000 mg | INTRAVENOUS | Status: DC | PRN
  Administered 2019-03-16 – 2019-03-17 (×2): 1 mg via INTRAVENOUS
  Filled 2019-03-16 (×2): qty 1

## 2019-03-16 MED ORDER — FENTANYL CITRATE (PF) 100 MCG/2ML IJ SOLN
50.0000 ug | INTRAMUSCULAR | Status: DC
  Filled 2019-03-16: qty 2

## 2019-03-16 MED ORDER — ACETAMINOPHEN 500 MG PO TABS: 1000.0000 mg | ORAL_TABLET | Freq: Once | ORAL | Status: DC

## 2019-03-16 MED ORDER — TRANEXAMIC ACID-NACL 1000-0.7 MG/100ML-% IV SOLN
1000.0000 mg | INTRAVENOUS | Status: AC
  Administered 2019-03-16: 09:00:00 1000 mg via INTRAVENOUS
  Filled 2019-03-16: qty 100

## 2019-03-16 MED ORDER — SODIUM CHLORIDE 0.9 % IR SOLN
Status: DC | PRN
  Administered 2019-03-16: 1000 mL

## 2019-03-16 MED ORDER — SERTRALINE HCL 50 MG PO TABS
150.0000 mg | ORAL_TABLET | Freq: Every day | ORAL | Status: DC
  Filled 2019-03-16: qty 1

## 2019-03-16 MED ORDER — DIPHENHYDRAMINE HCL 12.5 MG/5ML PO ELIX: 12.5000 mg | ORAL_SOLUTION | ORAL | Status: DC | PRN

## 2019-03-16 MED ORDER — POLYETHYLENE GLYCOL 3350 17 G PO PACK: 17.0000 g | PACK | Freq: Every day | ORAL | Status: DC | PRN

## 2019-03-16 MED ORDER — CELECOXIB 200 MG PO CAPS
400.0000 mg | ORAL_CAPSULE | Freq: Once | ORAL | Status: AC
  Administered 2019-03-16: 400 mg via ORAL
  Filled 2019-03-16: qty 2

## 2019-03-16 MED ORDER — VANCOMYCIN HCL IN DEXTROSE 1-5 GM/200ML-% IV SOLN
1000.0000 mg | INTRAVENOUS | Status: DC
  Filled 2019-03-16: qty 200

## 2019-03-16 MED ORDER — MIDAZOLAM HCL 2 MG/2ML IJ SOLN
INTRAMUSCULAR | Status: AC
  Filled 2019-03-16: qty 2

## 2019-03-16 MED ORDER — FLEET ENEMA 7-19 GM/118ML RE ENEM: 1.0000 | ENEMA | Freq: Once | RECTAL | Status: DC | PRN

## 2019-03-16 MED ORDER — ONDANSETRON HCL 4 MG PO TABS: 4.0000 mg | ORAL_TABLET | Freq: Four times a day (QID) | ORAL | Status: DC | PRN

## 2019-03-16 MED ORDER — PHENOL 1.4 % MT LIQD: 1.0000 | OROMUCOSAL | Status: DC | PRN

## 2019-03-16 SURGICAL SUPPLY — 63 items
ATTUNE MED DOME PAT 38 KNEE (Knees) ×1 IMPLANT
ATTUNE MED DOME PAT 38MM KNEE (Knees) ×1 IMPLANT
ATTUNE PS FEM RT SZ 5 CEM KNEE (Femur) ×2 IMPLANT
BAG SPEC THK2 15X12 ZIP CLS (MISCELLANEOUS)
BAG ZIPLOCK 12X15 (MISCELLANEOUS) ×1 IMPLANT
BASE TIBIAL ROT PLAT SZ 5 KNEE (Knees) IMPLANT
BLADE SAG 18X100X1.27 (BLADE) ×3 IMPLANT
BLADE SAW SGTL 11.0X1.19X90.0M (BLADE) ×3 IMPLANT
BLADE SURG SZ10 CARB STEEL (BLADE) ×6 IMPLANT
BNDG ELASTIC 6X5.8 VLCR STR LF (GAUZE/BANDAGES/DRESSINGS) ×3 IMPLANT
BOWL SMART MIX CTS (DISPOSABLE) ×3 IMPLANT
BSPLAT TIB 5 CMNT ROT PLAT STR (Knees) ×1 IMPLANT
CEMENT HV SMART SET (Cement) ×6 IMPLANT
CLOSURE WOUND 1/2 X4 (GAUZE/BANDAGES/DRESSINGS) ×2
COVER SURGICAL LIGHT HANDLE (MISCELLANEOUS) ×3 IMPLANT
COVER WAND RF STERILE (DRAPES) IMPLANT
CUFF TOURN SGL QUICK 34 (TOURNIQUET CUFF) ×3
CUFF TRNQT CYL 34X4.125X (TOURNIQUET CUFF) ×1 IMPLANT
DECANTER SPIKE VIAL GLASS SM (MISCELLANEOUS) ×3 IMPLANT
DRAPE U-SHAPE 47X51 STRL (DRAPES) ×3 IMPLANT
DRSG ADAPTIC 3X8 NADH LF (GAUZE/BANDAGES/DRESSINGS) ×3 IMPLANT
DRSG PAD ABDOMINAL 8X10 ST (GAUZE/BANDAGES/DRESSINGS) ×3 IMPLANT
DURAPREP 26ML APPLICATOR (WOUND CARE) ×3 IMPLANT
ELECT REM PT RETURN 15FT ADLT (MISCELLANEOUS) ×3 IMPLANT
EVACUATOR 1/8 PVC DRAIN (DRAIN) ×3 IMPLANT
GAUZE SPONGE 4X4 12PLY STRL (GAUZE/BANDAGES/DRESSINGS) ×3 IMPLANT
GLOVE BIO SURGEON STRL SZ7 (GLOVE) ×3 IMPLANT
GLOVE BIO SURGEON STRL SZ8 (GLOVE) ×13 IMPLANT
GLOVE BIOGEL PI IND STRL 6.5 (GLOVE) ×1 IMPLANT
GLOVE BIOGEL PI IND STRL 7.0 (GLOVE) ×1 IMPLANT
GLOVE BIOGEL PI IND STRL 8 (GLOVE) ×1 IMPLANT
GLOVE BIOGEL PI INDICATOR 6.5 (GLOVE) ×2
GLOVE BIOGEL PI INDICATOR 7.0 (GLOVE) ×2
GLOVE BIOGEL PI INDICATOR 8 (GLOVE) ×2
GLOVE SURG SS PI 6.5 STRL IVOR (GLOVE) ×3 IMPLANT
GOWN STRL REUS W/TWL LRG LVL3 (GOWN DISPOSABLE) ×11 IMPLANT
HANDPIECE INTERPULSE COAX TIP (DISPOSABLE) ×3
HOLDER FOLEY CATH W/STRAP (MISCELLANEOUS) IMPLANT
IMMOBILIZER KNEE 20 (SOFTGOODS) ×3
IMMOBILIZER KNEE 20 THIGH 36 (SOFTGOODS) ×1 IMPLANT
INSERT TIBIAL KNEE SZ 5 12MM (Insert) IMPLANT
KIT TURNOVER KIT A (KITS) IMPLANT
MANIFOLD NEPTUNE II (INSTRUMENTS) ×3 IMPLANT
NS IRRIG 1000ML POUR BTL (IV SOLUTION) ×3 IMPLANT
PACK TOTAL KNEE CUSTOM (KITS) ×3 IMPLANT
PADDING CAST COTTON 6X4 STRL (CAST SUPPLIES) ×9 IMPLANT
PENCIL SMOKE EVACUATOR (MISCELLANEOUS) IMPLANT
PIN DRILL FIX HALF THREAD (BIT) ×2 IMPLANT
PIN STEINMAN FIXATION KNEE (PIN) ×2 IMPLANT
PROTECTOR NERVE ULNAR (MISCELLANEOUS) ×3 IMPLANT
SET HNDPC FAN SPRY TIP SCT (DISPOSABLE) ×1 IMPLANT
STRIP CLOSURE SKIN 1/2X4 (GAUZE/BANDAGES/DRESSINGS) ×4 IMPLANT
SUT MNCRL AB 4-0 PS2 18 (SUTURE) ×3 IMPLANT
SUT STRATAFIX 0 PDS 27 VIOLET (SUTURE) ×3
SUT VIC AB 2-0 CT1 27 (SUTURE) ×12
SUT VIC AB 2-0 CT1 TAPERPNT 27 (SUTURE) ×3 IMPLANT
SUTURE STRATFX 0 PDS 27 VIOLET (SUTURE) ×1 IMPLANT
TIBIAL BASE ROT PLAT SZ 5 KNEE (Knees) ×3 IMPLANT
TIBIAL INSERT KNEE SZ 5 12MM (Insert) ×3 IMPLANT
TRAY FOLEY MTR SLVR 16FR STAT (SET/KITS/TRAYS/PACK) ×3 IMPLANT
WATER STERILE IRR 1000ML POUR (IV SOLUTION) ×6 IMPLANT
WRAP KNEE MAXI GEL POST OP (GAUZE/BANDAGES/DRESSINGS) ×3 IMPLANT
YANKAUER SUCT BULB TIP 10FT TU (MISCELLANEOUS) ×3 IMPLANT

## 2019-03-16 NOTE — Interval H&P Note (Signed)
History and Physical Interval Note:  03/16/2019 6:55 AM  Victoria [name]  has presented today for surgery, with the diagnosis of right knee osteoarthritis.  The various methods of treatment have been discussed with the patient and family. After consideration of risks, benefits and other options for treatment, the patient has consented to  Procedure(s) with comments: TOTAL KNEE ARTHROPLASTY (Right) - 53min as a surgical intervention.  The patient's history has been reviewed, patient examined, no change in status, stable for surgery.  I have reviewed the patient's chart and labs.  Questions were answered to the patient's satisfaction.     Pilar Plate [name]

## 2019-03-16 NOTE — Anesthesia Procedure Notes (Signed)
Anesthesia Regional Block: Adductor canal block   Pre-Anesthetic Checklist: ,, timeout performed, Correct Patient, Correct Site, Correct Laterality, Correct Procedure, Correct Position, site marked, Risks and benefits discussed,  Surgical consent,  Pre-op evaluation,  At surgeon's request and post-op pain management  Laterality: Right  Prep: chloraprep       Needles:  Injection technique: Single-shot  Needle Type: Stimulator Needle - 80     Needle Length: 10cm  Needle Gauge: 21     Additional Needles:   Narrative:  Start time: 03/16/2019 7:47 AM End time: 03/16/2019 7:57 AM Injection made incrementally with aspirations every 5 mL.  Performed by: Personally

## 2019-03-16 NOTE — Transfer of Care (Signed)
Immediate Anesthesia Transfer of Care Note  Patient: Victoria [name]  Procedure(s) Performed: TOTAL KNEE ARTHROPLASTY (Right Knee)  Patient Location: PACU  Anesthesia Type:Spinal  Level of Consciousness: awake, alert , oriented and patient cooperative  Airway & Oxygen Therapy: Patient Spontanous Breathing and Patient connected to face mask oxygen  Post-op Assessment: Report given to RN and Post -op Vital signs reviewed and stable  Post vital signs: Reviewed and stable  Last Vitals:  Vitals Value Taken Time  BP 103/62 03/16/19 1000  Temp    Pulse 67 03/16/19 1001  Resp 14 03/16/19 1001  SpO2 100 % 03/16/19 1001  Vitals shown include unvalidated device data.  Last Pain:  Vitals:   03/16/19 0611  TempSrc: Oral      Patients Stated Pain Goal: 4 ([id number])  Complications: No apparent anesthesia complications

## 2019-03-16 NOTE — Discharge Instructions (Addendum)
° °Dr. Frank Aluisio °Total Joint Specialist °[facility] °[address] °[place], [place] [address] °[phone] ° °TOTAL KNEE REPLACEMENT POSTOPERATIVE DIRECTIONS ° °Knee Rehabilitation, Guidelines Following Surgery  °Results after knee surgery are often greatly improved when you follow the exercise, range of motion and muscle strengthening exercises prescribed by your doctor. Safety measures are also important to protect the knee from further injury. Any time any of these exercises cause you to have increased pain or swelling in your knee joint, decrease the amount until you are comfortable again and slowly increase them. If you have problems or questions, call your caregiver or physical therapist for advice.  ° °HOME CARE INSTRUCTIONS  °• Remove items at home which could result in a fall. This includes throw rugs or furniture in walking pathways.  °· ICE to the affected knee every three hours for 30 minutes at a time and then as needed for pain and swelling.  Continue to use ice on the knee for pain and swelling from surgery. You may notice swelling that will progress down to the foot and ankle.  This is normal after surgery.  Elevate the leg when you are not up walking on it.   °· Continue to use the breathing machine which will help keep your temperature down.  It is common for your temperature to cycle up and down following surgery, especially at night when you are not up moving around and exerting yourself.  The breathing machine keeps your lungs expanded and your temperature down. °· Do not place pillow under knee, focus on keeping the knee straight while resting ° °DIET °You may resume your previous home diet once your are discharged from the hospital. ° °DRESSING / WOUND CARE / SHOWERING °You may change your dressing 3-5 days after surgery.  Then change the dressing every day with sterile gauze.  Please use good hand washing techniques before changing the dressing.  Do not use any lotions  or creams on the incision until instructed by your surgeon. °You may start showering once you are discharged home but do not submerge the incision under water. Just pat the incision dry and apply a dry gauze dressing on daily. °Change the surgical dressing daily and reapply a dry dressing each time. ° °ACTIVITY °Walk with your walker as instructed. °Use walker as long as suggested by your caregivers. °Avoid periods of inactivity such as sitting longer than an hour when not asleep. This helps prevent blood clots.  °You may resume a sexual relationship in one month or when given the OK by your doctor.  °You may return to work once you are cleared by your doctor.  °Do not drive a car for 6 weeks or until released by you surgeon.  °Do not drive while taking narcotics. ° °WEIGHT BEARING °Weight bearing as tolerated with assist device (walker, cane, etc) as directed, use it as long as suggested by your surgeon or therapist, typically at least 4-6 weeks. ° °POSTOPERATIVE CONSTIPATION PROTOCOL °Constipation - defined medically as fewer than three stools per week and severe constipation as less than one stool per week. ° °One of the most common issues patients have following surgery is constipation.  Even if you have a regular bowel pattern at home, your normal regimen is likely to be disrupted due to multiple reasons following surgery.  Combination of anesthesia, postoperative narcotics, change in appetite and fluid intake all can affect your bowels.  In order to avoid complications following surgery, here are some   recommendations in order to help you during your recovery period. ° °Colace (docusate) - Pick up an over-the-counter form of Colace or another stool softener and take twice a day as long as you are requiring postoperative pain medications.  Take with a full glass of water daily.  If you experience loose stools or diarrhea, hold the colace until you stool forms back up.  If your symptoms do not get better within 1  week or if they get worse, check with your doctor. ° °Dulcolax (bisacodyl) - Pick up over-the-counter and take as directed by the product packaging as needed to assist with the movement of your bowels.  Take with a full glass of water.  Use this product as needed if not relieved by Colace only.  ° °MiraLax (polyethylene glycol) - Pick up over-the-counter to have on hand.  MiraLax is a solution that will increase the amount of water in your bowels to assist with bowel movements.  Take as directed and can mix with a glass of water, juice, soda, coffee, or tea.  Take if you go more than two days without a movement. °Do not use MiraLax more than once per day. Call your doctor if you are still constipated or irregular after using this medication for 7 days in a row. ° °If you continue to have problems with postoperative constipation, please contact the office for further assistance and recommendations.  If you experience "the worst abdominal pain ever" or develop nausea or vomiting, please contact the office immediatly for further recommendations for treatment. ° °ITCHING °If you experience itching with your medications, try taking only a single pain pill, or even half a pain pill at a time.  You can also use Benadryl over the counter for itching or also to help with sleep.  ° °TED HOSE STOCKINGS °Wear the elastic stockings on both legs for three weeks following surgery during the day but you may remove then at night for sleeping. ° °MEDICATIONS °See your medication summary on the “After Visit Summary” that the nursing staff will review with you prior to discharge.  You may have some home medications which will be placed on hold until you complete the course of blood thinner medication.  It is important for you to complete the blood thinner medication as prescribed by your surgeon.  Continue your approved medications as instructed at time of discharge. ° °PRECAUTIONS °If you experience chest pain or shortness of breath -  call 911 immediately for transfer to the hospital emergency department.  °If you develop a fever greater that 101 F, purulent drainage from wound, increased redness or drainage from wound, foul odor from the wound/dressing, or calf pain - CONTACT YOUR SURGEON.   °                                                °FOLLOW-UP APPOINTMENTS °Make sure you keep all of your appointments after your operation with your surgeon and caregivers. You should call the office at the above phone number and make an appointment for approximately two weeks after the date of your surgery or on the date instructed by your surgeon outlined in the "After Visit Summary". ° °RANGE OF MOTION AND STRENGTHENING EXERCISES  °Rehabilitation of the knee is important following a knee injury or an operation. After just a few days of immobilization, the muscles of   the thigh which control the knee become weakened and shrink (atrophy). Knee exercises are designed to build up the tone and strength of the thigh muscles and to improve knee motion. Often times heat used for twenty to thirty minutes before working out will loosen up your tissues and help with improving the range of motion but do not use heat for the first two weeks following surgery. These exercises can be done on a training (exercise) mat, on the floor, on a table or on a bed. Use what ever works the best and is most comfortable for you Knee exercises include:   Leg Lifts - While your knee is still immobilized in a splint or cast, you can do straight leg raises. Lift the leg to 60 degrees, hold for 3 sec, and slowly lower the leg. Repeat 10-20 times 2-3 times daily. Perform this exercise against resistance later as your knee gets better.   Quad and Hamstring Sets - Tighten up the muscle on the front of the thigh (Quad) and hold for 5-10 sec. Repeat this 10-20 times hourly. Hamstring sets are done by pushing the foot backward against an object and holding for 5-10 sec. Repeat as with quad  sets.   Leg Slides: Lying on your back, slowly slide your foot toward your buttocks, bending your knee up off the floor (only go as far as is comfortable). Then slowly slide your foot back down until your leg is flat on the floor again.  Angel Wings: Lying on your back spread your legs to the side as far apart as you can without causing discomfort.  A rehabilitation program following serious knee injuries can speed recovery and prevent re-injury in the future due to weakened muscles. Contact your doctor or a physical therapist for more information on knee rehabilitation.   IF YOU ARE TRANSFERRED TO A SKILLED REHAB FACILITY If the patient is transferred to a skilled rehab facility following release from the hospital, a list of the current medications will be sent to the facility for the patient to continue.  When discharged from the skilled rehab facility, please have the facility set up the patient's [place] prior to being released. Also, the skilled facility will be responsible for providing the patient with their medications at time of release from the facility to include their pain medication, the muscle relaxants, and their blood thinner medication. If the patient is still at the rehab facility at time of the two week follow up appointment, the skilled rehab facility will also need to assist the patient in arranging follow up appointment in our office and any transportation needs.  MAKE SURE YOU:   Understand these instructions.   Get help right away if you are not doing well or get worse.    Pick up stool softner and laxative for home use following surgery while on pain medications. Do not submerge incision under water. Please use good hand washing techniques while changing dressing each day. May shower starting three days after surgery. Please use a clean towel to pat the incision dry following showers. Continue to use ice for pain and swelling after surgery. Do not  use any lotions or creams on the incision until instructed by your surgeon.  Information on my medicine - XARELTO (Rivaroxaban)  This medication education was reviewed with me or my healthcare representative as part of my discharge preparation.  The pharmacist that spoke with me during my hospital stay was:    Why was Xarelto prescribed  for you? Xarelto was prescribed for you to reduce the risk of blood clots forming after orthopedic surgery. The medical term for these abnormal blood clots is venous thromboembolism (VTE).  What do you need to know about xarelto ? Take your Xarelto ONCE DAILY at the same time every day. You may take it either with or without food.  If you have difficulty swallowing the tablet whole, you may crush it and mix in applesauce just prior to taking your dose.  Take Xarelto exactly as prescribed by your doctor and DO NOT stop taking Xarelto without talking to the doctor who prescribed the medication.  Stopping without other VTE prevention medication to take the place of Xarelto may increase your risk of developing a clot.  After discharge, you should have regular check-up appointments with your healthcare provider that is prescribing your Xarelto.    What do you do if you miss a dose? If you miss a dose, take it as soon as you remember on the same day then continue your regularly scheduled once daily regimen the next day. Do not take two doses of Xarelto on the same day.   Important Safety Information A possible side effect of Xarelto is bleeding. You should call your healthcare provider right away if you experience any of the following: ? Bleeding from an injury or your nose that does not stop. ? Unusual colored urine (red or dark brown) or unusual colored stools (red or black). ? Unusual bruising for unknown reasons. ? A serious fall or if you hit your head (even if there is no bleeding).  Some medicines may interact with Xarelto and might increase  your risk of bleeding while on Xarelto. To help avoid this, consult your healthcare provider or pharmacist prior to using any new prescription or non-prescription medications, including herbals, vitamins, non-steroidal anti-inflammatory drugs (NSAIDs) and supplements.  This website has more information on Xarelto: [email or web address]

## 2019-03-16 NOTE — Anesthesia Procedure Notes (Signed)
Spinal  Patient location during procedure: OR Start time: 03/16/2019 8:41 AM End time: 03/16/2019 8:29 AM Staffing Resident/CRNA: Garrel Ridgel, CRNA Performed: resident/CRNA  Preanesthetic Checklist Completed: patient identified, site marked, surgical consent, pre-op evaluation, timeout performed, IV checked, risks and benefits discussed and monitors and equipment checked Spinal Block Patient position: sitting Prep: Betadine Patient monitoring: heart rate, continuous pulse ox and blood pressure Injection technique: single-shot Needle Needle type: Pencan  Needle gauge: 24 G Needle length: 9 cm Needle insertion depth: 4 cm Assessment Sensory level: T10 Additional Notes Expiration date of kit checked and confirmed. Patient tolerated procedure well, without complications.

## 2019-03-16 NOTE — Op Note (Signed)
OPERATIVE REPORT-TOTAL KNEE ARTHROPLASTY   Pre-operative diagnosis- Osteoarthritis  Right knee(s)  Post-operative diagnosis- Osteoarthritis Right knee(s)  Procedure-  Right  Total Knee Arthroplasty (Depuy Attune)  Surgeon- Dione Plover. Bria Portales, MD  Assistant- Griffith Citron, PA-C   Anesthesia-  Adductor canal block and spinal  EBL-20 mL   Drains Hemovac  Tourniquet time-  Total Tourniquet Time Documented: Thigh (Right) - 31 minutes Total: Thigh (Right) - 31 minutes     Complications- None  Condition-PACU - hemodynamically stable.   Brief Clinical Note  Victoria [name] is a 68 y.o. year old female with end stage OA of her right knee with progressively worsening pain and dysfunction. She has constant pain, with activity and at rest and significant functional deficits with difficulties even with ADLs. She has had extensive non-op management including analgesics, injections of cortisone and viscosupplements, and home exercise program, but remains in significant pain with significant dysfunction.Radiographs show bone on bone arthritis medial and patellofemoral. She presents now for right Total Knee Arthroplasty.    Procedure in detail---   The patient is brought into the operating room and positioned supine on the operating table. After successful administration of  Adductor canal block and spinal,   a tourniquet is placed high on the  Right thigh(s) and the lower extremity is prepped and draped in the usual sterile fashion. Time out is performed by the operating team and then the  Right lower extremity is wrapped in Esmarch, knee flexed and the tourniquet inflated to 300 mmHg.       A midline incision is made with a ten blade through the subcutaneous tissue to the level of the extensor mechanism. A fresh blade is used to make a medial parapatellar arthrotomy. Soft tissue over the proximal medial tibia is subperiosteally elevated to the joint line with a knife and into the semimembranosus  bursa with a Cobb elevator. Soft tissue over the proximal lateral tibia is elevated with attention being paid to avoiding the patellar tendon on the tibial tubercle. The patella is everted, knee flexed 90 degrees and the ACL and PCL are removed. Findings are bone on bone medial and patellofemoral with large global osteophytes        The drill is used to create a starting hole in the distal femur and the canal is thoroughly irrigated with sterile saline to remove the fatty contents. The 5 degree Right  valgus alignment guide is placed into the femoral canal and the distal femoral cutting block is pinned to remove 9 mm off the distal femur. Resection is made with an oscillating saw.      The tibia is subluxed forward and the menisci are removed. The extramedullary alignment guide is placed referencing proximally at the medial aspect of the tibial tubercle and distally along the second metatarsal axis and tibial crest. The block is pinned to remove 62mm off the more deficient medial  side. Resection is made with an oscillating saw. Size 5is the most appropriate size for the tibia and the proximal tibia is prepared with the modular drill and keel punch for that size.      The femoral sizing guide is placed and size 5 is most appropriate. Rotation is marked off the epicondylar axis and confirmed by creating a rectangular flexion gap at 90 degrees. The size 5 cutting block is pinned in this rotation and the anterior, posterior and chamfer cuts are made with the oscillating saw. The intercondylar block is then placed and that cut is made.  Trial size 5 tibial component, trial size 5 posterior stabilized femur and a 12  mm posterior stabilized rotating platform insert trial is placed. Full extension is achieved with excellent varus/valgus and anterior/posterior balance throughout full range of motion. The patella is everted and thickness measured to be 22  mm. Free hand resection is taken to 12 mm, a 38 template is  placed, lug holes are drilled, trial patella is placed, and it tracks normally. Osteophytes are removed off the posterior femur with the trial in place. All trials are removed and the cut bone surfaces prepared with pulsatile lavage. Cement is mixed and once ready for implantation, the size 5 tibial implant, size  5 posterior stabilized femoral component, and the size 38 patella are cemented in place and the patella is held with the clamp. The trial insert is placed and the knee held in full extension. The Exparel (20 ml mixed with 60 ml saline) is injected into the extensor mechanism, posterior capsule, medial and lateral gutters and subcutaneous tissues.  All extruded cement is removed and once the cement is hard the permanent 12 mm posterior stabilized rotating platform insert is placed into the tibial tray.      The wound is copiously irrigated with saline solution and the extensor mechanism closed over a hemovac drain with #1 V-loc suture. The tourniquet is released for a total tourniquet time of 31  minutes. Flexion against gravity is 140 degrees and the patella tracks normally. Subcutaneous tissue is closed with 2.0 vicryl and subcuticular with running 4.0 Monocryl. The incision is cleaned and dried and steri-strips and a bulky sterile dressing are applied. The limb is placed into a knee immobilizer and the patient is awakened and transported to recovery in stable condition.      Please note that a surgical assistant was a medical necessity for this procedure in order to perform it in a safe and expeditious manner. Surgical assistant was necessary to retract the ligaments and vital neurovascular structures to prevent injury to them and also necessary for proper positioning of the limb to allow for anatomic placement of the prosthesis.   Dione Plover Janyce Ellinger, MD    03/16/2019, 9:31 AM

## 2019-03-16 NOTE — Evaluation (Signed)
Physical Therapy Evaluation Patient Details Name: Victoria [name] MRN: [id number] DOB: Feb 14, 1951 Today's Date: 03/16/2019   History of Present Illness  s/p R TKA  Clinical Impression  Pt is s/p TKA resulting in the deficits listed below (see PT Problem List).  Pt amb ~ 34' with RW and min/guard assist. Anticipate steady progress in acute setting.  Pt will benefit from skilled PT to increase their independence and safety with mobility to allow discharge to the venue listed below.      Follow Up Recommendations Follow surgeon's recommendation for DC plan and follow-up therapies    Equipment Recommendations  Rolling walker with 5" wheels    Recommendations for Other Services       Precautions / Restrictions Precautions Precautions: Fall;Knee Required Braces or Orthoses: Knee Immobilizer - Right      Mobility  Bed Mobility Overal bed mobility: Needs Assistance Bed Mobility: Supine to Sit     Supine to sit: Min guard     General bed mobility comments: for safety  Transfers Overall transfer level: Needs assistance Equipment used: Rolling walker (2 wheeled) Transfers: Sit to/from Stand Sit to Stand: Min guard;Min assist         General transfer comment: cues for hand placement and RLE position  Ambulation/Gait Ambulation/Gait assistance: Min assist;Min guard Gait Distance (Feet): 80 Feet Assistive device: Rolling walker (2 wheeled) Gait Pattern/deviations: Step-to pattern;Decreased stance time - right;Decreased weight shift to right     General Gait Details: verbal cues for sequence and RW safety  Stairs            Wheelchair Mobility    Modified Rankin (Stroke Patients Only)       Balance                                             Pertinent Vitals/Pain Pain Assessment: 0-10 Pain Score: 5  Pain Location: right knee Pain Descriptors / Indicators: Grimacing;Sore Pain Intervention(s): Limited activity within patient's  tolerance;Monitored during session;Repositioned;Patient requesting pain meds-RN notified;Ice applied    Home Living Family/patient expects to be discharged to:: Private residence Living Arrangements: Spouse/significant other     Home Access: Stairs to enter   Technical brewer of Steps: 3 Home Layout: Able to live on main level with bedroom/bathroom Home Equipment: Walker - 4 wheels      Prior Function Level of Independence: Independent               Hand Dominance        Extremity/Trunk Assessment   Upper Extremity Assessment Upper Extremity Assessment: Overall WFL for tasks assessed    Lower Extremity Assessment Lower Extremity Assessment: RLE deficits/detail RLE Deficits / Details: ankle WFL, knee extension and hip flexion grossly 2+/5; AAROM ~ 10 to  60 degrees knee flexion       Communication   Communication: No difficulties  Cognition                                              General Comments      Exercises     Assessment/Plan    PT Assessment Patient needs continued PT services  PT Problem List Decreased strength;Decreased range of motion;Decreased activity tolerance;Decreased mobility;Pain;Decreased knowledge of use of DME;Decreased safety awareness;Decreased balance  PT Treatment Interventions DME instruction;Functional mobility training;Therapeutic activities;Therapeutic exercise;Gait training;Patient/family education    PT Goals (Current goals can be found in the Care Plan section)  Acute Rehab PT Goals PT Goal Formulation: With patient Time For Goal Achievement: 03/23/19 Potential to Achieve Goals: Good    Frequency 7X/week   Barriers to discharge        Co-evaluation               AM-PAC PT "6 Clicks" Mobility  Outcome Measure Help needed turning from your back to your side while in a flat bed without using bedrails?: A Little Help needed moving from lying on your back to sitting on the side  of a flat bed without using bedrails?: A Little Help needed moving to and from a bed to a chair (including a wheelchair)?: A Little Help needed standing up from a chair using your arms (e.g., wheelchair or bedside chair)?: A Little Help needed to walk in hospital room?: A Little Help needed climbing 3-5 steps with a railing? : A Little 6 Click Score: 18    End of Session Equipment Utilized During Treatment: Gait belt Activity Tolerance: Patient tolerated treatment well Patient left: in chair;with call bell/phone within reach;with family/visitor present   PT Visit Diagnosis: Difficulty in walking, not elsewhere classified (R26.2)    Time: 1600-1620 PT Time Calculation (min) (ACUTE ONLY): 20 min   Charges:   PT Evaluation $PT Eval Low Complexity: 1 Low          Kenyon Ana, PT  Pager: [phone] Acute Rehab Dept [facility]): [phone]   03/16/2019   [facility] 03/16/2019, 7:06 PM

## 2019-03-16 NOTE — Anesthesia Postprocedure Evaluation (Signed)
Anesthesia Post Note  Patient: Victoria [name]  Procedure(s) Performed: TOTAL KNEE ARTHROPLASTY (Right Knee)     Patient location during evaluation: PACU Anesthesia Type: Spinal Level of consciousness: awake and alert Pain management: pain level controlled Vital Signs Assessment: post-procedure vital signs reviewed and stable Respiratory status: spontaneous breathing and respiratory function stable Cardiovascular status: blood pressure returned to baseline and stable Postop Assessment: spinal receding Anesthetic complications: no    Last Vitals:  Vitals:   03/16/19 1045 03/16/19 1100  BP: 109/61 94/65  Pulse: 60 62  Resp: 12 12  Temp:  (!) 36.4 C  SpO2: 100% 100%    Last Pain:  Vitals:   03/16/19 1100  TempSrc:   PainSc: 0-No pain                 Leotis Isham DANIEL

## 2019-03-17 ENCOUNTER — Encounter (HOSPITAL_COMMUNITY): Payer: Self-pay | Admitting: Orthopedic Surgery

## 2019-03-17 LAB — BASIC METABOLIC PANEL
Anion gap: 9 (ref 5–15)
BUN: 9 mg/dL (ref 8–23)
CO2: 24 mmol/L (ref 22–32)
Calcium: 8.3 mg/dL — ABNORMAL LOW (ref 8.9–10.3)
Chloride: 105 mmol/L (ref 98–111)
Creatinine, Ser: 0.8 mg/dL (ref 0.44–1.00)
GFR calc Af Amer: >60 mL/min (ref >60)
GFR calc non Af Amer: >60 mL/min (ref >60)
Glucose, Bld: 127 mg/dL — ABNORMAL HIGH (ref 70–99)
Potassium: 4.2 mmol/L (ref 3.5–5.1)
Sodium: 138 mmol/L (ref 135–145)

## 2019-03-17 LAB — CBC
HCT: 32.5 % — ABNORMAL LOW (ref 36.0–46.0)
Hemoglobin: 10.1 g/dL — ABNORMAL LOW (ref 12.0–15.0)
MCH: 29.2 pg (ref 26.0–34.0)
MCHC: 31.1 g/dL (ref 30.0–36.0)
MCV: 93.9 fL (ref 80.0–100.0)
Platelets: 168 10*3/uL (ref 150–400)
RBC: 3.46 MIL/uL — ABNORMAL LOW (ref 3.87–5.11)
RDW: 12.7 % (ref 11.5–15.5)
WBC: 11.7 10*3/uL — ABNORMAL HIGH (ref 4.0–10.5)
nRBC: 0 % (ref 0.0–0.2)

## 2019-03-17 MED ORDER — METHOCARBAMOL 500 MG PO TABS: 500.0000 mg | ORAL_TABLET | Freq: Four times a day (QID) | ORAL | 0 refills | Status: DC | PRN

## 2019-03-17 MED ORDER — RIVAROXABAN 10 MG PO TABS: 10.0000 mg | ORAL_TABLET | Freq: Every day | ORAL | 0 refills | Status: DC

## 2019-03-17 MED ORDER — OXYCODONE HCL 5 MG PO TABS: 5.0000 mg | ORAL_TABLET | Freq: Four times a day (QID) | ORAL | 0 refills | Status: DC | PRN

## 2019-03-17 NOTE — TOC Transition Note (Signed)
Transition of Care [facility]) - CM/SW Discharge Note   Patient Details  Name: Victoria [name] MRN: [id number] Date of Birth: 1950-09-15  Transition of [facility]) CM/SW Contact:  Lia Hopping, Benjamin Perez Phone Number: 03/17/2019, 11:27 AM   Clinical Narrative:    Physical Therapy:OPPT DME delivered by [employer]   Final next level of care: OP Rehab Barriers to Discharge: No Barriers Identified   Patient Goals and CMS Choice     Choice offered to / list presented to : NA  Discharge Placement                       Discharge Plan and Services                DME Arranged: 3-N-1, Walker rolling DME Agency: [employer] Date DME Agency Contacted: 03/17/19 Time DME Agency Contacted: 0900 Representative spoke with at DME Agency: [place] ([place]) Interventions     Readmission Risk Interventions No flowsheet data found.

## 2019-03-17 NOTE — Progress Notes (Signed)
03/17/19 1300  PT Visit Information  Last PT Received On 03/17/19  Pt progressing very well. Reviewed HEP and gait progression this pm. Pt is ready to d/c from PT standpoint  Assistance Needed +1  History of Present Illness s/p R TKA  Precautions  Precautions Fall;Knee  Precaution Comments IND SLRs today  Required Braces or Orthoses Knee Immobilizer - Right  Knee Immobilizer - Right Discontinue once straight leg raise with < 10 degree lag  Restrictions  Weight Bearing Restrictions No  Other Position/Activity Restrictions WBAT  Pain Assessment  Pain Assessment 0-10  Pain Score 3  Pain Location right knee  Pain Descriptors / Indicators Grimacing;Sore  Pain Intervention(s) Limited activity within patient's tolerance;Monitored during session;Premedicated before session;Repositioned  Cognition  Arousal/Alertness Awake/alert  Behavior During Therapy WFL for tasks assessed/performed  Overall Cognitive Status Within Functional Limits for tasks assessed  Bed Mobility  Overal bed mobility Needs Assistance  Supine to sit Modified independent (Device/Increase time)  General bed mobility comments for safety  Transfers  Overall transfer level Needs assistance  Equipment used Rolling walker (2 wheeled)  Transfers Sit to/from Stand  Sit to Stand Supervision  General transfer comment cues for hand placement and RLE position  Ambulation/Gait  Ambulation/Gait assistance Min guard;Supervision  Gait Distance (Feet) 160 Feet  Assistive device Rolling walker (2 wheeled)  Gait Pattern/deviations Step-to pattern;Decreased stance time - right;Decreased weight shift to right  General Gait Details initial cues for RW distance from self   Total Joint Exercises  Ankle Circles/Pumps AROM;10 reps;Both  Quad Sets AROM;Both;10 reps  Short Arc Quad AROM;Right;10 reps  Heel Slides AAROM;Right;10 reps;AROM  Hip ABduction/ADduction AROM;Right;10 reps  Straight Leg Raises AROM;AAROM;Right;10 reps  Long  Arc Quad AROM;Right;10 reps  Knee Flexion AROM;AAROM;Right;10 reps;Seated  Goniometric ROM grossly 6 to 70 degrees right knee flexion  PT - End of Session  Equipment Utilized During Treatment Gait belt  Activity Tolerance Patient tolerated treatment well  Patient left in chair;with call bell/phone within reach;with family/visitor present   PT - Assessment/Plan  PT Plan Current plan remains appropriate  PT Visit Diagnosis Difficulty in walking, not elsewhere classified (R26.2)  PT Frequency (ACUTE ONLY) 7X/week  Follow Up Recommendations Follow surgeon's recommendation for DC plan and follow-up therapies  PT equipment Rolling walker with 5" wheels  AM-PAC PT "6 Clicks" Mobility Outcome Measure (Version 2)  Help needed turning from your back to your side while in a flat bed without using bedrails? 3  Help needed moving from lying on your back to sitting on the side of a flat bed without using bedrails? 4  Help needed moving to and from a bed to a chair (including a wheelchair)? 3  Help needed standing up from a chair using your arms (e.g., wheelchair or bedside chair)? 3  Help needed to walk in hospital room? 3  Help needed climbing 3-5 steps with a railing?  3  6 Click Score 19  Consider Recommendation of Discharge To: Home with [facility]  PT Goal Progression  Progress towards PT goals Progressing toward goals  Acute Rehab PT Goals  PT Goal Formulation With patient  Time For Goal Achievement 03/23/19  Potential to Achieve Goals Good  PT Time Calculation  PT Start Time (ACUTE ONLY) 1315  PT Stop Time (ACUTE ONLY) 1341  PT Time Calculation (min) (ACUTE ONLY) 26 min  PT General Charges  $$ ACUTE PT VISIT 1 Visit  PT Treatments  $Gait Training 8-22 mins  $Therapeutic Exercise 8-22 mins

## 2019-03-17 NOTE — Progress Notes (Signed)
Physical Therapy Treatment Patient Details Name: Victoria [name] MRN: [id number] DOB: Aug 09, 1950 Today's Date: 03/17/2019    History of Present Illness s/p R TKA    PT Comments    Pt progressing well. Will see again in pm and should    Follow Up Recommendations  Follow surgeon's recommendation for DC plan and follow-up therapies     Equipment Recommendations  Rolling walker with 5" wheels    Recommendations for Other Services       Precautions / Restrictions Precautions Precautions: Fall;Knee Precaution Comments: IND SLRs today Required Braces or Orthoses: Knee Immobilizer - Right Knee Immobilizer - Right: Discontinue once straight leg raise with < 10 degree lag Restrictions Weight Bearing Restrictions: No Other Position/Activity Restrictions: WBAT    Mobility  Bed Mobility Overal bed mobility: Needs Assistance       Supine to sit: Supervision;Min guard     General bed mobility comments: for safety  Transfers Overall transfer level: Needs assistance Equipment used: Rolling walker (2 wheeled) Transfers: Sit to/from Stand Sit to Stand: Supervision;Min guard         General transfer comment: cues for hand placement and RLE position  Ambulation/Gait Ambulation/Gait assistance: Min guard;Supervision Gait Distance (Feet): 100 Feet Assistive device: Rolling walker (2 wheeled) Gait Pattern/deviations: Step-to pattern;Decreased stance time - right;Decreased weight shift to right     General Gait Details: verbal cues for sequence and RW safety   Stairs Stairs: Yes Stairs assistance: Min guard;Min assist Stair Management: Step to pattern;Two rails;Forwards Number of Stairs: 3 General stair comments: cues for sequence and safety   Wheelchair Mobility    Modified Rankin (Stroke Patients Only)       Balance                                            Cognition Arousal/Alertness: Awake/alert Behavior During Therapy: WFL for tasks  assessed/performed Overall Cognitive Status: Within Functional Limits for tasks assessed                                        Exercises      General Comments        Pertinent Vitals/Pain Pain Assessment: 0-10 Pain Score: 4  Pain Location: right knee Pain Descriptors / Indicators: Grimacing;Sore Pain Intervention(s): Limited activity within patient's tolerance;Monitored during session;Premedicated before session;Repositioned;Ice applied    Home Living                      Prior Function            PT Goals (current goals can now be found in the care plan section) Acute Rehab PT Goals PT Goal Formulation: With patient Time For Goal Achievement: 03/23/19 Potential to Achieve Goals: Good Progress towards PT goals: Progressing toward goals    Frequency    7X/week      PT Plan Current plan remains appropriate    Co-evaluation              AM-PAC PT "6 Clicks" Mobility   Outcome Measure  Help needed turning from your back to your side while in a flat bed without using bedrails?: A Little Help needed moving from lying on your back to sitting on the side of a flat bed without using bedrails?: None  Help needed moving to and from a bed to a chair (including a wheelchair)?: A Little Help needed standing up from a chair using your arms (e.g., wheelchair or bedside chair)?: A Little Help needed to walk in hospital room?: A Little Help needed climbing 3-5 steps with a railing? : A Little 6 Click Score: 19    End of Session         PT Visit Diagnosis: Difficulty in walking, not elsewhere classified (R26.2)     Time: [id number] PT Time Calculation (min) (ACUTE ONLY): 19 min  Charges:  $Gait Training: 8-22 mins                     Kenyon Ana, PT  Pager: [phone] Acute Rehab Dept [facility]): [phone]   03/17/2019    [facility] 03/17/2019, 12:57 PM

## 2019-03-17 NOTE — Progress Notes (Signed)
   Subjective: 1 Day Post-Op Procedure(s) (LRB): TOTAL KNEE ARTHROPLASTY (Right) Patient reports pain as mild.   Patient seen in rounds by Dr. Wynelle Link. Patient is well, and has had no acute complaints or problems other than soreness in the right knee. Denies chest pain, SOB, or calf pain. Foley catheter to be removed this AM, had no issues overnight.  We will continue therapy today.   Objective: Vital signs in last 24 hours: Temp:  [97.5 F (36.4 C)-98.8 F (37.1 C)] 98.2 F (36.8 C) (11/24 0551) Pulse Rate:  [57-90] 90 (11/24 0551) Resp:  [12-18] 18 (11/24 0551) BP: (88-109)/(56-69) 108/69 (11/24 0551) SpO2:  [92 %-100 %] 100 % (11/24 0551)  Intake/Output from previous day:  Intake/Output Summary (Last 24 hours) at 03/17/2019 0715 Last data filed at 03/17/2019 0600 Gross per 24 hour  Intake 3419.8 ml  Output 2295 ml  Net 1124.8 ml    Labs: Recent Labs    03/17/19 0311  HGB 10.1*   Recent Labs    03/17/19 0311  WBC 11.7*  RBC 3.46*  HCT 32.5*  PLT 168   Recent Labs    03/17/19 0311  NA 138  K 4.2  CL 105  CO2 24  BUN 9  CREATININE 0.80  GLUCOSE 127*  CALCIUM 8.3*   Exam: General - Patient is Alert and Oriented Extremity - Neurologically intact Neurovascular intact Sensation intact distally Dorsiflexion/Plantar flexion intact Dressing - dressing C/D/I Motor Function - intact, moving foot and toes well on exam.   Past Medical History:  Diagnosis Date  . Arthritis    neck  . Cancer ([place]) 07/2016   right breast cancer  . Depression   . Family history of adverse reaction to anesthesia    mother had cardiac arrest and PONV  . Headache    migraine  . History of kidney stones   . No pertinent past medical history     Assessment/Plan: 1 Day Post-Op Procedure(s) (LRB): TOTAL KNEE ARTHROPLASTY (Right) Principal Problem:   OA (osteoarthritis) of knee  Estimated body mass index is 26.7 kg/m as calculated from the following:   Height as of this  encounter: 5\' 3"  (1.6 m).   Weight as of this encounter: 68.4 kg. Advance diet Up with therapy D/C IV fluids  Anticipated LOS equal to or greater than 2 midnights due to - Age 56 and older with one or more of the following:  - Obesity  - Expected need for hospital services (PT, OT, Nursing) required for safe  discharge  - Anticipated need for postoperative skilled nursing care or inpatient rehab  - Active co-morbidities: None OR   - Unanticipated findings during/Post Surgery: None  - Patient is a high risk of re-admission due to: None    DVT Prophylaxis - Xarelto Weight bearing as tolerated. D/C O2 and pulse ox and try on room air. Hemovac pulled without difficulty, will continue therapy today.  Plan is to go Home after hospital stay. Should be cleared for discharge later today if continues to progress with therapy. Scheduled for OPPT, will follow-up in the office in 2 weeks.   Theresa Duty, PA-C Orthopedic Surgery 03/17/2019, 7:15 AM

## 2019-03-18 NOTE — Discharge Summary (Signed)
Physician Discharge Summary   Patient ID: Victoria [name] MRN: [id number] DOB/AGE: November 18, 1950 68 y.o.  Admit date: 03/16/2019 Discharge date: 03/17/2019  Primary Diagnosis: Osteoarthritis Right knee(s)  Admission Diagnoses:  Past Medical History:  Diagnosis Date  . Arthritis    neck  . Cancer ([place]) 07/2016   right breast cancer  . Depression   . Family history of adverse reaction to anesthesia    mother had cardiac arrest and PONV  . Headache    migraine  . History of kidney stones   . No pertinent past medical history    Discharge Diagnoses:   Principal Problem:   OA (osteoarthritis) of knee  Estimated body mass index is 26.7 kg/m as calculated from the following:   Height as of this encounter: 5\' 3"  (1.6 m).   Weight as of this encounter: 68.4 kg.  Procedure:  Procedure(s) (LRB): TOTAL KNEE ARTHROPLASTY (Right)   Consults: None  HPI: Victoria [name] is a 68 y.o. year old female with end stage OA of her right knee with progressively worsening pain and dysfunction. She has constant pain, with activity and at rest and significant functional deficits with difficulties even with ADLs. She has had extensive non-op management including analgesics, injections of cortisone and viscosupplements, and home exercise program, but remains in significant pain with significant dysfunction.Radiographs show bone on bone arthritis medial and patellofemoral. She presents now for right Total Knee Arthroplasty.    Laboratory Data: Admission on 03/16/2019, Discharged on 03/17/2019  Component Date Value Ref Range Status  . WBC 03/17/2019 11.7* 4.0 - 10.5 K/uL Final  . RBC 03/17/2019 3.46* 3.87 - 5.11 MIL/uL Final  . Hemoglobin 03/17/2019 10.1* 12.0 - 15.0 g/dL Final  . HCT 03/17/2019 32.5* 36.0 - 46.0 % Final  . MCV 03/17/2019 93.9  80.0 - 100.0 fL Final  . MCH 03/17/2019 29.2  26.0 - 34.0 pg Final  . MCHC 03/17/2019 31.1  30.0 - 36.0 g/dL Final  . RDW 03/17/2019 12.7  11.5 - 15.5 % Final  .  Platelets 03/17/2019 168  150 - 400 K/uL Final  . nRBC 03/17/2019 0.0  0.0 - 0.2 % Final   Performed at [facility], [place] [address], [place], [place] [address]  . Sodium 03/17/2019 138  135 - 145 mmol/L Final  . Potassium 03/17/2019 4.2  3.5 - 5.1 mmol/L Final  . Chloride 03/17/2019 105  98 - 111 mmol/L Final  . CO2 03/17/2019 24  22 - 32 mmol/L Final  . Glucose, Bld 03/17/2019 127* 70 - 99 mg/dL Final  . BUN 03/17/2019 9  8 - 23 mg/dL Final  . Creatinine, Ser 03/17/2019 0.80  0.44 - 1.00 mg/dL Final  . Calcium 03/17/2019 8.3* 8.9 - 10.3 mg/dL Final  . GFR calc non Af Amer 03/17/2019 >60  >60 mL/min Final  . GFR calc Af Amer 03/17/2019 >60  >60 mL/min Final  . Anion gap 03/17/2019 9  5 - 15 Final   Performed at [facility], [place] [address] [place], [place] [address]  Hospital Outpatient Visit on 03/12/2019  Component Date Value Ref Range Status  . SARS-CoV-2, NAA 03/12/2019 NOT DETECTED  NOT DETECTED Final   Comment: (NOTE) This nucleic acid amplification test was developed and its performance characteristics determined by [employer]. Nucleic acid amplification tests include PCR and TMA. This test has not been FDA cleared or approved. This test has been authorized by FDA under an Emergency Use Authorization (EUA). This test is only authorized  for the duration of time the declaration that circumstances exist justifying the authorization of the emergency use of in vitro diagnostic tests for detection of SARS-CoV-2 virus and/or diagnosis of COVID-19 infection under section 564(b)(1) of the Act, 21 U.S.C. [id number]) (1), unless the authorization is terminated or revoked sooner. When diagnostic testing is negative, the possibility of a false negative result should be considered in the context of a patient's recent exposures and the presence of clinical signs and symptoms consistent with COVID-19. An individual without symptoms of COVID-  19 and who is not shedding SARS-CoV-2 vi                          rus would expect to have a negative (not detected) result in this assay. Performed At: [facility] [place], [place] [id number] Rush Farmer MD [id number]   . Coronavirus Source 03/12/2019 NASOPHARYNGEAL   Final   Performed at [facility], [place] [address] [place], [place] [address]  Hospital Outpatient Visit on 03/12/2019  Component Date Value Ref Range Status  . aPTT 03/12/2019 31  24 - 36 seconds Final   Performed at [facility], [place] [address], [place], [place] [address]  . WBC 03/12/2019 4.5  4.0 - 10.5 K/uL Final  . RBC 03/12/2019 4.19  3.87 - 5.11 MIL/uL Final  . Hemoglobin 03/12/2019 12.2  12.0 - 15.0 g/dL Final  . HCT 03/12/2019 39.0  36.0 - 46.0 % Final  . MCV 03/12/2019 93.1  80.0 - 100.0 fL Final  . MCH 03/12/2019 29.1  26.0 - 34.0 pg Final  . MCHC 03/12/2019 31.3  30.0 - 36.0 g/dL Final  . RDW 03/12/2019 12.7  11.5 - 15.5 % Final  . Platelets 03/12/2019 194  150 - 400 K/uL Final  . nRBC 03/12/2019 0.0  0.0 - 0.2 % Final  . Neutrophils Relative % 03/12/2019 48  % Final  . Neutro Abs 03/12/2019 2.1  1.7 - 7.7 K/uL Final  . Lymphocytes Relative 03/12/2019 39  % Final  . Lymphs Abs 03/12/2019 1.8  0.7 - 4.0 K/uL Final  . Monocytes Relative 03/12/2019 11  % Final  . Monocytes Absolute 03/12/2019 0.5  0.1 - 1.0 K/uL Final  . Eosinophils Relative 03/12/2019 1  % Final  . Eosinophils Absolute 03/12/2019 0.1  0.0 - 0.5 K/uL Final  . Basophils Relative 03/12/2019 1  % Final  . Basophils Absolute 03/12/2019 0.1  0.0 - 0.1 K/uL Final  . Immature Granulocytes 03/12/2019 0  % Final  . Abs Immature Granulocytes 03/12/2019 0.00  0.00 - 0.07 K/uL Final   Performed at [facility], [place] [address], [place], [place] [address]  . Sodium 03/12/2019 138  135 - 145 mmol/L Final  . Potassium 03/12/2019 4.1  3.5 - 5.1 mmol/L Final  . Chloride 03/12/2019  103  98 - 111 mmol/L Final  . CO2 03/12/2019 24  22 - 32 mmol/L Final  . Glucose, Bld 03/12/2019 88  70 - 99 mg/dL Final  . BUN 03/12/2019 9  8 - 23 mg/dL Final  . Creatinine, Ser 03/12/2019 0.74  0.44 - 1.00 mg/dL Final  . Calcium 03/12/2019 8.9  8.9 - 10.3 mg/dL Final  . Total Protein 03/12/2019 6.9  6.5 - 8.1 g/dL Final  . Albumin 03/12/2019 4.2  3.5 - 5.0 g/dL Final  . AST 03/12/2019 28  15 - 41 U/L Final  . ALT 03/12/2019 27  0 - 44 U/L Final  . Alkaline Phosphatase 03/12/2019 61  38 - 126 U/L Final  . Total Bilirubin 03/12/2019 0.4  0.3 - 1.2 mg/dL Final  . GFR calc non Af Amer 03/12/2019 >60  >60 mL/min Final  . GFR calc Af Amer 03/12/2019 >60  >60 mL/min Final  . Anion gap 03/12/2019 11  5 - 15 Final   Performed at [facility], [place] [address], [place], [place] [address]  . Prothrombin Time 03/12/2019 13.1  11.4 - 15.2 seconds Final  . INR 03/12/2019 1.0  0.8 - 1.2 Final   Comment: (NOTE) INR goal varies based on device and disease states. Performed at [facility], [place] [address], [place], [place] [address]   . ABO/RH(D) 03/12/2019 O POS   Final  . Antibody Screen 03/12/2019 NEG   Final  . Sample Expiration 03/12/2019 03/19/2019,2359   Final  . Extend sample reason 03/12/2019    Final                   Value:NO TRANSFUSIONS OR PREGNANCY IN THE PAST 3 MONTHS Performed at [facility], [place] [address] [place], [place] [address]   . MRSA, PCR 03/12/2019 NEGATIVE  NEGATIVE Final  . Staphylococcus aureus 03/12/2019 POSITIVE* NEGATIVE Final   Comment: (NOTE) The Xpert SA Assay (FDA approved for NASAL specimens in patients 3 years of age and older), is one component of a comprehensive surveillance program. It is not intended to diagnose infection nor to guide or monitor treatment. Performed at [facility], [place] [address], [place], [place] [address]   . ABO/RH(D) 03/12/2019    Final                    Value:O POS Performed at [facility], [place] [address], [place], [place] [address]      X-Rays:No results found.  EKG: Orders placed or performed during the hospital encounter of 03/12/19  . EKG  . EKG     Hospital Course: Victoria [name] is a 68 y.o. who was admitted to [facility]. They were brought to the operating room on 03/16/2019 and underwent Procedure(s): TOTAL KNEE ARTHROPLASTY.  Patient tolerated the procedure well and was later transferred to the recovery room and then to the orthopaedic floor for postoperative care. They were given PO and IV analgesics for pain control following their surgery. They were given 24 hours of postoperative antibiotics of  Anti-infectives (From admission, onward)   Start     Dose/Rate Route Frequency Ordered Stop   03/16/19 1430  ceFAZolin (ANCEF) IVPB 2g/100 mL premix     2 g 200 mL/hr over 30 Minutes Intravenous Every 6 hours 03/16/19 1122 03/16/19 2038   03/16/19 0745  ceFAZolin (ANCEF) IVPB 2g/100 mL premix  Status:  Discontinued     2 g 200 mL/hr over 30 Minutes Intravenous  Once 03/16/19 0738 03/16/19 1112   03/16/19 0615  vancomycin (VANCOCIN) IVPB 1000 mg/200 mL premix  Status:  Discontinued     1,000 mg 200 mL/hr over 60 Minutes Intravenous On call to O.R. 03/16/19 [id number] 03/16/19 0737     and started on DVT prophylaxis in the form of Xarelto.   PT and OT were ordered for total joint protocol. Discharge planning consulted to help with postop disposition and equipment needs.  Patient had a good night on the evening of surgery. They started to get up OOB with therapy on POD #  0. Pt was seen during rounds and was ready to go home pending progress with therapy. Hemovac drain was pulled without difficulty. She worked with therapy on POD #1 and was meeting her goals. Pt was discharged to home later that day in stable condition.  Diet: Regular diet Activity: WBAT Follow-up: in 2 weeks Disposition: Home Discharged  Condition: good   Discharge Instructions    Call MD / Call 911   Complete by: As directed    If you experience chest pain or shortness of breath, CALL 911 and be transported to the hospital emergency room.  If you develope a fever above 101 F, pus (white drainage) or increased drainage or redness at the wound, or calf pain, call your surgeon's office.   Change dressing   Complete by: As directed    Change dressing on Wednesday, then change the dressing daily with sterile 4 x 4 inch gauze dressing and apply TED hose.   Constipation Prevention   Complete by: As directed    Drink plenty of fluids.  Prune juice may be helpful.  You may use a stool softener, such as Colace (over the counter) 100 mg twice a day.  Use MiraLax (over the counter) for constipation as needed.   Diet - low sodium heart healthy   Complete by: As directed    Discharge instructions   Complete by: As directed    Dr. Gaynelle Arabian Total Joint Specialist [facility] [address] [place], [place] [address] [phone]  TOTAL KNEE REPLACEMENT POSTOPERATIVE DIRECTIONS  Knee Rehabilitation, Guidelines Following Surgery  Results after knee surgery are often greatly improved when you follow the exercise, range of motion and muscle strengthening exercises prescribed by your doctor. Safety measures are also important to protect the knee from further injury. Any time any of these exercises cause you to have increased pain or swelling in your knee joint, decrease the amount until you are comfortable again and slowly increase them. If you have problems or questions, call your caregiver or physical therapist for advice.   HOME CARE INSTRUCTIONS  Remove items at home which could result in a fall. This includes throw rugs or furniture in walking pathways.  ICE to the affected knee every three hours for 30 minutes at a time and then as needed for pain and swelling.  Continue to use ice on the knee for pain and swelling  from surgery. You may notice swelling that will progress down to the foot and ankle.  This is normal after surgery.  Elevate the leg when you are not up walking on it.   Continue to use the breathing machine which will help keep your temperature down.  It is common for your temperature to cycle up and down following surgery, especially at night when you are not up moving around and exerting yourself.  The breathing machine keeps your lungs expanded and your temperature down. Do not place pillow under knee, focus on keeping the knee straight while resting   DIET You may resume your previous home diet once your are discharged from the hospital.  DRESSING / WOUND CARE / SHOWERING You may change your dressing 3-5 days after surgery.  Then change the dressing every day with sterile gauze.  Please use good hand washing techniques before changing the dressing.  Do not use any lotions or creams on the incision until instructed by your surgeon. You may start showering once you are discharged home but do not submerge the incision under  water. Just pat the incision dry and apply a dry gauze dressing on daily. Change the surgical dressing daily and reapply a dry dressing each time.  ACTIVITY Walk with your walker as instructed. Use walker as long as suggested by your caregivers. Avoid periods of inactivity such as sitting longer than an hour when not asleep. This helps prevent blood clots.  You may resume a sexual relationship in one month or when given the OK by your doctor.  You may return to work once you are cleared by your doctor.  Do not drive a car for 6 weeks or until released by you surgeon.  Do not drive while taking narcotics.  WEIGHT BEARING Weight bearing as tolerated with assist device (walker, cane, etc) as directed, use it as long as suggested by your surgeon or therapist, typically at least 4-6 weeks.  POSTOPERATIVE CONSTIPATION PROTOCOL Constipation - defined medically as fewer than  three stools per week and severe constipation as less than one stool per week.  One of the most common issues patients have following surgery is constipation.  Even if you have a regular bowel pattern at home, your normal regimen is likely to be disrupted due to multiple reasons following surgery.  Combination of anesthesia, postoperative narcotics, change in appetite and fluid intake all can affect your bowels.  In order to avoid complications following surgery, here are some recommendations in order to help you during your recovery period.  Colace (docusate) - Pick up an over-the-counter form of Colace or another stool softener and take twice a day as long as you are requiring postoperative pain medications.  Take with a full glass of water daily.  If you experience loose stools or diarrhea, hold the colace until you stool forms back up.  If your symptoms do not get better within 1 week or if they get worse, check with your doctor.  Dulcolax (bisacodyl) - Pick up over-the-counter and take as directed by the product packaging as needed to assist with the movement of your bowels.  Take with a full glass of water.  Use this product as needed if not relieved by Colace only.   MiraLax (polyethylene glycol) - Pick up over-the-counter to have on hand.  MiraLax is a solution that will increase the amount of water in your bowels to assist with bowel movements.  Take as directed and can mix with a glass of water, juice, soda, coffee, or tea.  Take if you go more than two days without a movement. Do not use MiraLax more than once per day. Call your doctor if you are still constipated or irregular after using this medication for 7 days in a row.  If you continue to have problems with postoperative constipation, please contact the office for further assistance and recommendations.  If you experience "the worst abdominal pain ever" or develop nausea or vomiting, please contact the office immediatly for further  recommendations for treatment.  ITCHING  If you experience itching with your medications, try taking only a single pain pill, or even half a pain pill at a time.  You can also use Benadryl over the counter for itching or also to help with sleep.   TED HOSE STOCKINGS Wear the elastic stockings on both legs for three weeks following surgery during the day but you may remove then at night for sleeping.  MEDICATIONS See your medication summary on the "After Visit Summary" that the nursing staff will review with you prior to discharge.  You may  have some home medications which will be placed on hold until you complete the course of blood thinner medication.  It is important for you to complete the blood thinner medication as prescribed by your surgeon.  Continue your approved medications as instructed at time of discharge.  PRECAUTIONS If you experience chest pain or shortness of breath - call 911 immediately for transfer to the hospital emergency department.  If you develop a fever greater that 101 F, purulent drainage from wound, increased redness or drainage from wound, foul odor from the wound/dressing, or calf pain - CONTACT YOUR SURGEON.                                                   FOLLOW-UP APPOINTMENTS Make sure you keep all of your appointments after your operation with your surgeon and caregivers. You should call the office at the above phone number and make an appointment for approximately two weeks after the date of your surgery or on the date instructed by your surgeon outlined in the "After Visit Summary".   RANGE OF MOTION AND STRENGTHENING EXERCISES  Rehabilitation of the knee is important following a knee injury or an operation. After just a few days of immobilization, the muscles of the thigh which control the knee become weakened and shrink (atrophy). Knee exercises are designed to build up the tone and strength of the thigh muscles and to improve knee motion. Often times heat  used for twenty to thirty minutes before working out will loosen up your tissues and help with improving the range of motion but do not use heat for the first two weeks following surgery. These exercises can be done on a training (exercise) mat, on the floor, on a table or on a bed. Use what ever works the best and is most comfortable for you Knee exercises include:  Leg Lifts - While your knee is still immobilized in a splint or cast, you can do straight leg raises. Lift the leg to 60 degrees, hold for 3 sec, and slowly lower the leg. Repeat 10-20 times 2-3 times daily. Perform this exercise against resistance later as your knee gets better.  Quad and Hamstring Sets - Tighten up the muscle on the front of the thigh (Quad) and hold for 5-10 sec. Repeat this 10-20 times hourly. Hamstring sets are done by pushing the foot backward against an object and holding for 5-10 sec. Repeat as with quad sets.  Leg Slides: Lying on your back, slowly slide your foot toward your buttocks, bending your knee up off the floor (only go as far as is comfortable). Then slowly slide your foot back down until your leg is flat on the floor again. Angel Wings: Lying on your back spread your legs to the side as far apart as you can without causing discomfort.  A rehabilitation program following serious knee injuries can speed recovery and prevent re-injury in the future due to weakened muscles. Contact your doctor or a physical therapist for more information on knee rehabilitation.   IF YOU ARE TRANSFERRED TO A SKILLED REHAB FACILITY If the patient is transferred to a skilled rehab facility following release from the hospital, a list of the current medications will be sent to the facility for the patient to continue.  When discharged from the skilled rehab facility, please have the facility set  up the patient's [place] prior to being released. Also, the skilled facility will be responsible for providing the  patient with their medications at time of release from the facility to include their pain medication, the muscle relaxants, and their blood thinner medication. If the patient is still at the rehab facility at time of the two week follow up appointment, the skilled rehab facility will also need to assist the patient in arranging follow up appointment in our office and any transportation needs.  MAKE SURE YOU:  Understand these instructions.  Get help right away if you are not doing well or get worse.    Pick up stool softner and laxative for home use following surgery while on pain medications. Do not submerge incision under water. Please use good hand washing techniques while changing dressing each day. May shower starting three days after surgery. Please use a clean towel to pat the incision dry following showers. Continue to use ice for pain and swelling after surgery. Do not use any lotions or creams on the incision until instructed by your surgeon.   Do not put a pillow under the knee. Place it under the heel.   Complete by: As directed    Driving restrictions   Complete by: As directed    No driving for two weeks   TED hose   Complete by: As directed    Use stockings (TED hose) for three weeks on both leg(s).  You may remove them at night for sleeping.   Weight bearing as tolerated   Complete by: As directed      Allergies as of 03/17/2019      Reactions   Darvon [propoxyphene Hcl] Other (See Comments)   hyperactivity   Penicillins Itching   Has patient had a PCN reaction causing immediate rash, facial/tongue/throat swelling, SOB or lightheadedness with hypotension: No Has patient had a PCN reaction causing severe rash involving mucus membranes or skin necrosis: No Has patient had a PCN reaction that required hospitalization: No Has patient had a PCN reaction occurring within the last 10 years: No If all of the above answers are "NO", then may proceed with Cephalosporin use.       Medication List    STOP taking these medications   Aleve 220 MG tablet Generic drug: naproxen sodium   Calcium Carbonate-Vitamin D 600-400 MG-UNIT tablet   glucosamine-chondroitin 500-400 MG tablet   ibuprofen 200 MG tablet Commonly known as: ADVIL   multivitamin-iron-minerals-folic acid chewable tablet   Vitamin D 50 MCG (2000 UT) Caps   vitamin E 400 UNIT capsule     TAKE these medications   clonazePAM 0.5 MG tablet Commonly known as: KLONOPIN TAKE 1-2 TABLETS BY MOUTH AT BEDTIME AS NEEDED FOR SLEEP What changed:   when to take this  additional instructions   gabapentin 300 MG capsule Commonly known as: NEURONTIN Take 2 capsules (600 mg total) by mouth at bedtime.   methocarbamol 500 MG tablet Commonly known as: ROBAXIN Take 1 tablet (500 mg total) by mouth every 6 (six) hours as needed for muscle spasms.   oxyCODONE 5 MG immediate release tablet Commonly known as: Oxy IR/ROXICODONE Take 1-2 tablets (5-10 mg total) by mouth every 6 (six) hours as needed for severe pain.   rivaroxaban 10 MG Tabs tablet Commonly known as: XARELTO Take 1 tablet (10 mg total) by mouth daily with breakfast for 20 days. Then take one 81 mg aspirin once a day for three weeks. Then discontinue  aspirin.   sertraline 100 MG tablet Commonly known as: ZOLOFT Take 150 mg by mouth daily.   tamoxifen 20 MG tablet Commonly known as: NOLVADEX Take 1 tablet (20 mg total) by mouth daily.            Discharge Care Instructions  (From admission, onward)         Start     Ordered   03/17/19 0000  Weight bearing as tolerated     03/17/19 0718   03/17/19 0000  Change dressing    Comments: Change dressing on Wednesday, then change the dressing daily with sterile 4 x 4 inch gauze dressing and apply TED hose.   03/17/19 [id number]         Follow-up Information    Gaynelle Arabian, MD. Schedule an appointment as soon as possible for a visit on 03/31/2019.   Specialty: Orthopedic  Surgery Contact information: [address] [place] [address] [phone]           Signed: Griffith Citron, PA-C Orthopedic Surgery 03/18/2019, 8:07 AM

## 2019-03-23 NOTE — Progress Notes (Signed)
[place]  [phone] [phone]) [phone]     ID: Victoria [name] DOB: [birth year]  MR#: [id number]  CLE#:[id number]  Patient Care Team: Cari Caraway, MD as PCP - General (Family Medicine) Exavior Kimmons, Virgie Dad, MD as Consulting Physician (Oncology) Rolm Bookbinder, MD as Consulting Physician (General Surgery) Kyung Rudd, MD as Consulting Physician (Radiation Oncology) Christene Slates, MD as Physician Assistant (Radiology) Delice Bison, Charlestine Massed, NP as Nurse Practitioner (Hematology and Oncology) OTHER MD:   CHIEF COMPLAINT: Estrogen receptor positive breast cancer  CURRENT TREATMENT: Tamoxifen   INTERVAL HISTORY: I was unable to contact Victoria [name] today for follow-up of her estrogen receptor positive breast cancer.   Since her last visit, she has not undergone any additional studies. Her most recent mammography was at [facility] in 08/2018.  This showed a possible abnormality in the left breast at 2:00.  Ultrasound 09/17/2018 found this to be a simple cyst.  She did undergo total right knee arthroplasty on 03/16/2019 under Dr. Maureen Ralphs.   REVIEW OF SYSTEMS: I was unable to contact the patient by phone today   BREAST CANCER HISTORY: From the original intake note:  Victoria [name] had routine bilateral screening mammography at [facility] 07/20/2016. The breast density was category B. In the right breast lower inner quadrant there was an oval mass associated with architectural distortion. On 08/02/2016 the patient was brought back for further imaging. Right diagnostic mammography confirmed a 0.5 cm all nodule in the right breast at the 4:00 radiant. By ultrasonography this measured 0.4 cm and had lobulated margins.  Biopsy of the right breast mass in question 08/13/2016 showed ([id number]) invasive ductal carcinoma, grade 1, estrogen receptor 100% positive, progesterone receptor 100% positive, both with strong staining intensity, with an MIB-1 of 2%, and no HER-2 notification,  the signals ratio being 1.27 and the number per cell 2.10.  The patient's subsequent history is as detailed below   PAST MEDICAL HISTORY: Past Medical History:  Diagnosis Date  . Arthritis    neck  . Cancer ([place]) 07/2016   right breast cancer  . Depression   . Family history of adverse reaction to anesthesia    mother had cardiac arrest and PONV  . Headache    migraine  . History of kidney stones   . No pertinent past medical history     PAST SURGICAL HISTORY: Past Surgical History:  Procedure Laterality Date  . BACK SURGERY     diskectomy L5-6  . BREAST LUMPECTOMY WITH RADIOACTIVE SEED AND SENTINEL LYMPH NODE BIOPSY Right 09/03/2016   Procedure: RIGHT BREAST LUMPECTOMY WITH RADIOACTIVE SEED AND RIGHT AXILLARY SENTINEL NODE BIOPSY;  Surgeon: Rolm Bookbinder, MD;  Location: [facility];  Service: General;  Laterality: Right;  . CESAREAN SECTION     triplets  . DILATION AND CURETTAGE OF UTERUS    . KNEE ARTHROSCOPY Right   . RE-EXCISION OF BREAST LUMPECTOMY Right 09/11/2016   Procedure: RIGHT RE-EXCISION OF BREAST LUMPECTOMY;  Surgeon: Rolm Bookbinder, MD;  Location: [facility];  Service: General;  Laterality: Right;  . TOTAL KNEE ARTHROPLASTY Right 03/16/2019   Procedure: TOTAL KNEE ARTHROPLASTY;  Surgeon: Gaynelle Arabian, MD;  Location: [facility];  Service: Orthopedics;  Laterality: Right;  68mn    FAMILY HISTORY Family History  Problem Relation Age of Onset  . Breast cancer Paternal Aunt   . Breast cancer Cousin   The patient's father died from complications of emphysema at age 78924 The patient's mother died following a stroke at age 78925 The patient  had no brothers, 1 sister. A paternal aunt had breast cancer in her 71s and a paternal second cousin breast cancer in her 44s. There is no history of ovarian cancer in the family   GYNECOLOGIC HISTORY:  No LMP recorded. Patient is postmenopausal. Menarche age 52, first live birth age 39. The patient is GX P3. She  stopped having periods in 2004. She took hormone replacement until 2018, when she was diagnosed with breast cancer.   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 Her husband Victoria [name] is a Games developer. Her children, from a prior marriage, are Victoria [name] who lives in [place] and is an Chief Financial Officer, Victoria [name], who is completing her Spanish literature PhD program at [employer] May 2019 and will be working in a 10-year track position at [employer] (and whose husband is from [place]), and Victoria [name], who works in [place].    ADVANCED DIRECTIVES: The patient tells me she has named her daughter Victoria [name] as her healthcare power of attorney. Victoria [name] can be contacted at [phone].   HEALTH MAINTENANCE: Social History   Tobacco Use  . Smoking status: Never Smoker  . Smokeless tobacco: Never Used  Substance Use Topics  . Alcohol use: Yes    Comment: occ  . Drug use: No     Colonoscopy: 2016/[facility]     PAP:  Bone density: 2013   Allergies  Allergen Reactions  . Darvon [Propoxyphene Hcl] Other (See Comments)    hyperactivity  . Penicillins Itching    Has patient had a PCN reaction causing immediate rash, facial/tongue/throat swelling, SOB or lightheadedness with hypotension: No Has patient had a PCN reaction causing severe rash involving mucus membranes or skin necrosis: No Has patient had a PCN reaction that required hospitalization: No Has patient had a PCN reaction occurring within the last 10 years: No If all of the above answers are "NO", then may proceed with Cephalosporin use.     Current Outpatient Medications  Medication Sig Dispense Refill  . clonazePAM (KLONOPIN) 0.5 MG tablet TAKE 1-2 TABLETS BY MOUTH AT BEDTIME AS NEEDED FOR SLEEP (Patient taking differently: Take 0.5-1 mg by mouth at bedtime. ) 60 tablet 0  . gabapentin (NEURONTIN) 300 MG capsule Take 2 capsules (600 mg total) by mouth at  bedtime. 120 capsule 6  . methocarbamol (ROBAXIN) 500 MG tablet Take 1 tablet (500 mg total) by mouth every 6 (six) hours as needed for muscle spasms. 40 tablet 0  . oxyCODONE (OXY IR/ROXICODONE) 5 MG immediate release tablet Take 1-2 tablets (5-10 mg total) by mouth every 6 (six) hours as needed for severe pain. 30 tablet 0  . rivaroxaban (XARELTO) 10 MG TABS tablet Take 1 tablet (10 mg total) by mouth daily with breakfast for 20 days. Then take one 81 mg aspirin once a day for three weeks. Then discontinue aspirin. 20 tablet 0  . sertraline (ZOLOFT) 100 MG tablet Take 150 mg by mouth daily.    . tamoxifen (NOLVADEX) 20 MG tablet Take 1 tablet (20 mg total) by mouth daily. 90 tablet 4   No current facility-administered medications for this visit.     OBJECTIVE: Middle-aged white woman  There were no vitals filed for this visit.   There is no height or weight on [name] to calculate BMI.    ECOG FS:0 - Asymptomatic   LAB RESULTS:  CMP     Component Value Date/Time   NA 138  03/17/2019 0311   NA 143 08/22/2016 0831   K 4.2 03/17/2019 0311   K 4.3 08/22/2016 0831   CL 105 03/17/2019 0311   CO2 24 03/17/2019 0311   CO2 26 08/22/2016 0831   GLUCOSE 127 (H) 03/17/2019 0311   GLUCOSE 87 08/22/2016 0831   BUN 9 03/17/2019 0311   BUN 8.7 08/22/2016 0831   CREATININE 0.80 03/17/2019 0311   CREATININE 0.90 03/05/2018 1455   CREATININE 0.9 08/22/2016 0831   CALCIUM 8.3 (L) 03/17/2019 0311   CALCIUM 9.3 08/22/2016 0831   PROT 6.9 03/12/2019 1030   PROT 7.1 08/22/2016 0831   ALBUMIN 4.2 03/12/2019 1030   ALBUMIN 4.3 08/22/2016 0831   AST 28 03/12/2019 1030   AST 33 03/05/2018 1455   AST 29 08/22/2016 0831   ALT 27 03/12/2019 1030   ALT 44 03/05/2018 1455   ALT 29 08/22/2016 0831   ALKPHOS 61 03/12/2019 1030   ALKPHOS 85 08/22/2016 0831   BILITOT 0.4 03/12/2019 1030   BILITOT 0.2 (L) 03/05/2018 1455   BILITOT 0.35 08/22/2016 0831   GFRNONAA >60 03/17/2019 0311   GFRNONAA >60  03/05/2018 1455   GFRAA >60 03/17/2019 0311   GFRAA >60 03/05/2018 1455    No results found for: Ronnald Ramp, A1GS, A2GS, BETS, BETA2SER, GAMS, MSPIKE, SPEI  No results found for: Nils Pyle, [facility]  Lab Results  Component Value Date   WBC 11.7 (H) 03/17/2019   NEUTROABS 2.1 03/12/2019   HGB 10.1 (L) 03/17/2019   HCT 32.5 (L) 03/17/2019   MCV 93.9 03/17/2019   PLT 168 03/17/2019      Chemistry      Component Value Date/Time   NA 138 03/17/2019 0311   NA 143 08/22/2016 0831   K 4.2 03/17/2019 0311   K 4.3 08/22/2016 0831   CL 105 03/17/2019 0311   CO2 24 03/17/2019 0311   CO2 26 08/22/2016 0831   BUN 9 03/17/2019 0311   BUN 8.7 08/22/2016 0831   CREATININE 0.80 03/17/2019 0311   CREATININE 0.90 03/05/2018 1455   CREATININE 0.9 08/22/2016 0831      Component Value Date/Time   CALCIUM 8.3 (L) 03/17/2019 0311   CALCIUM 9.3 08/22/2016 0831   ALKPHOS 61 03/12/2019 1030   ALKPHOS 85 08/22/2016 0831   AST 28 03/12/2019 1030   AST 33 03/05/2018 1455   AST 29 08/22/2016 0831   ALT 27 03/12/2019 1030   ALT 44 03/05/2018 1455   ALT 29 08/22/2016 0831   BILITOT 0.4 03/12/2019 1030   BILITOT 0.2 (L) 03/05/2018 1455   BILITOT 0.35 08/22/2016 0831       No results found for: LABCA2  No components found for: [id number]  No results for input(s): INR in the last 168 hours.  Urinalysis No results found for: COLORURINE, APPEARANCEUR, LABSPEC, PHURINE, GLUCOSEU, HGBUR, BILIRUBINUR, KETONESUR, PROTEINUR, UROBILINOGEN, NITRITE, LEUKOCYTESUR   STUDIES: No results found.   ELIGIBLE FOR AVAILABLE RESEARCH PROTOCOL: No  ASSESSMENT: 68 y.o. [place] woman status post right breast lower inner quadrant biopsy 08/13/2016 for a clinical T1a N0, stage IA invasive ductal carcinoma, grade 1, estrogen and progesterone receptor positive, HER-2 not amplified, with an MIB-1 of 2%  (1) status post right lumpectomy and sentinel lymph node sampling  09/03/2016 for a pT1b pN0, stage IA invasive ductal carcinoma, grade 1, with a broadly positive anterior margin   (a) margins cleared with additional surgery 09/11/2016  (2) adjuvant radiation6/20/2018 to 11/07/2016 Site/dose:    1. The  Right breast was treated to 42.5 Gy in 17 fractions at 2.5 Gy per fraction. 2. The Right breast was boosted to 7.5 Gy in 3 fractions at 2.5 Gy per fraction.  (3) Tamoxifen started 12/25/2016  PLAN: Victoria [name] is now 2 and half years out from definitive surgery for breast cancer with no evidence of disease recurrence by her most recent mammography.  I was unable to contact her by phone today.  I left her following that she may call back on.  She did recently have surgery and was only discharged 03/17/2019.  I have refilled her tamoxifen, asked her to call us back and report, I made her a return appointment here in June, which will be after her next mammogram.  Domique Reardon, Virgie Dad, MD  03/24/19 3:53 PM Medical Oncology and Hematology [facility] [place], [place] [address] Tel. [phone]    Fax. [phone]    I, Wilburn Mylar, am acting as scribe for Dr. Virgie Dad. Aluel Schwarz.  I, Lurline Del MD, have reviewed the above documentation for accuracy and completeness, and I agree with the above.

## 2019-03-24 ENCOUNTER — Inpatient Hospital Stay: Payer: Medicare Other | Attending: Oncology | Admitting: Oncology

## 2019-03-24 ENCOUNTER — Other Ambulatory Visit: Payer: BC Managed Care – PPO

## 2019-03-24 ENCOUNTER — Telehealth: Payer: Self-pay

## 2019-03-24 DIAGNOSIS — Z170 Estrogen receptor positive status [ER+]: Secondary | ICD-10-CM

## 2019-03-24 DIAGNOSIS — C50311 Malignant neoplasm of lower-inner quadrant of right female breast: Secondary | ICD-10-CM

## 2019-03-24 NOTE — Telephone Encounter (Signed)
Patient left voicemail letting us know that she was contacted about missing her appointment and was able to reschedule for 04/27/2019.  MD notified.

## 2019-03-25 ENCOUNTER — Telehealth: Payer: Self-pay

## 2019-03-25 ENCOUNTER — Encounter: Payer: Self-pay | Admitting: Oncology

## 2019-03-25 ENCOUNTER — Telehealth: Payer: Self-pay | Admitting: Oncology

## 2019-03-25 MED ORDER — TAMOXIFEN CITRATE 20 MG PO TABS: 20.0000 mg | ORAL_TABLET | Freq: Every day | ORAL | 4 refills | Status: DC

## 2019-03-25 NOTE — Telephone Encounter (Signed)
I talk with patient regarding schedule however she seems to be confused as to why she has this June appointment

## 2019-03-25 NOTE — Telephone Encounter (Signed)
RN spoke with patient, per scheduling departments request, due to concerns with upcoming schedule.   Pt reports that she was unaware of appoitnment on 12/1 and has reschedule for January, and would like to keep this appointment vs following up in June.  RN notified pt we would be happy to keep this appointment, confirmed date and time for patient.

## 2019-03-25 NOTE — Telephone Encounter (Signed)
I talk with patient regarding schedule  

## 2019-04-27 ENCOUNTER — Telehealth: Payer: Self-pay | Admitting: Oncology

## 2019-04-27 ENCOUNTER — Inpatient Hospital Stay: Payer: Medicare Other | Admitting: Oncology

## 2019-04-27 DIAGNOSIS — Z96651 Presence of right artificial knee joint: Secondary | ICD-10-CM | POA: Insufficient documentation

## 2019-04-27 NOTE — Telephone Encounter (Signed)
Returned patient's phone call regarding cancelling 01/04 appointment, patient will call if she would like to reschedule.

## 2019-05-19 ENCOUNTER — Ambulatory Visit: Payer: Medicare Other

## 2019-05-28 ENCOUNTER — Ambulatory Visit: Payer: Medicare Other | Attending: Internal Medicine

## 2019-05-28 DIAGNOSIS — Z23 Encounter for immunization: Secondary | ICD-10-CM | POA: Insufficient documentation

## 2019-05-28 NOTE — Progress Notes (Signed)
   Covid-19 Vaccination Clinic  Name:  Victoria [name]    MRN: [id number] DOB: 05-20-1950  05/28/2019  [name] was observed post Covid-19 immunization for 15 minutes without incidence. She was provided with Vaccine Information Sheet and instruction to access the V-Safe system.   [name] was instructed to call 911 with any severe reactions post vaccine: Marland Kitchen Difficulty breathing  . Swelling of your face and throat  . A fast heartbeat  . A bad rash all over your body  . Dizziness and weakness    Immunizations Administered    Name Date Dose VIS Date Route   Pfizer COVID-19 Vaccine 05/28/2019  1:10 PM 0.3 mL 04/03/2019 Intramuscular   Manufacturer: [place]   Lot: [id number]   [place]: [id number]

## 2019-06-05 ENCOUNTER — Ambulatory Visit: Payer: Medicare Other

## 2019-06-23 ENCOUNTER — Ambulatory Visit: Payer: Medicare Other | Attending: Internal Medicine

## 2019-06-23 DIAGNOSIS — Z23 Encounter for immunization: Secondary | ICD-10-CM

## 2019-06-23 NOTE — Progress Notes (Signed)
   Covid-19 Vaccination Clinic  Name:  Victoria [name]    MRN: [id number] DOB: 06/29/1950  06/23/2019  Victoria [name] was observed post Covid-19 immunization for 15 minutes without incident. She was provided with Vaccine Information Sheet and instruction to access the V-Safe system.   Victoria [name] was instructed to call 911 with any severe reactions post vaccine: Marland Kitchen Difficulty breathing  . Swelling of face and throat  . A fast heartbeat  . A bad rash all over body  . Dizziness and weakness   Immunizations Administered    Name Date Dose VIS Date Route   Pfizer COVID-19 Vaccine 06/23/2019 12:56 PM 0.3 mL 04/03/2019 Intramuscular   Manufacturer: [place]   Lot: [id number]   [place]: [id number]

## 2019-09-28 ENCOUNTER — Other Ambulatory Visit: Payer: Self-pay | Admitting: Oncology

## 2019-09-29 ENCOUNTER — Other Ambulatory Visit: Payer: BC Managed Care – PPO

## 2019-09-29 ENCOUNTER — Ambulatory Visit: Payer: BC Managed Care – PPO | Admitting: Oncology

## 2019-10-11 NOTE — Progress Notes (Signed)
° [facility]  °[phone] [phone]  ° ° ° °ID: Victoria [name] DOB: 05/06/1950  MR#: [id number]  CSN#:[id number] ° °Patient Care Team: °McNeill, Wendy, MD as PCP - General (Family Medicine) °Magrinat, Gustav C, MD as Consulting Physician (Oncology) °Wakefield, Matthew, MD as Consulting Physician (General Surgery) °Moody, John, MD as Consulting Physician (Radiation Oncology) °Cornella, Rick, MD as Physician Assistant (Radiology) °Causey, Lindsey Cornetto, NP as Nurse Practitioner (Hematology and Oncology) °OTHER MD: ° ° °CHIEF COMPLAINT: Estrogen receptor positive breast cancer ° °CURRENT TREATMENT: Tamoxifen ° ° °INTERVAL HISTORY: °Victoria [name] returns today for follow-up of her estrogen receptor positive breast cancer.  ° °She continues on tamoxifen.  She has about 5 hot flashes daily, with some sweating, which is quite uncomfortable.  Vaginal wetness is not an issue.  The nighttime hot flashes are well controlled on gabapentin ° °Since her last visit, she underwent bilateral diagnostic mammography with tomography at [facility] on 09/22/2019 showing: breast density category B; no evidence of malignancy in either breast.  ° ° °REVIEW OF SYSTEMS: °Victoria [name] at the Pfizer vaccine and tolerated it well.  Her children have also been vaccinated and she gets to spend time with family frequently.  She has some osteoarthritis problems which are stable.  She walks about 3 miles a day for exercise.  A detailed review of systems today was otherwise benign. ° ° °BREAST CANCER HISTORY: °From the original intake note: ° °Victoria [name] had routine bilateral screening mammography at [facility] 07/20/2016. The breast density was category B. In the right breast lower inner quadrant there was an oval mass associated with architectural distortion. On 08/02/2016 the patient was brought back for further imaging. Right diagnostic mammography confirmed a 0.5 cm all nodule in the right breast at the 4:00 radiant. By ultrasonography  this measured 0.4 cm and had lobulated margins. ° °Biopsy of the right breast mass in question 08/13/2016 showed ([id number]) invasive ductal carcinoma, grade 1, estrogen receptor 100% positive, progesterone receptor 100% positive, both with strong staining intensity, with an MIB-1 of 2%, and no HER-2 notification, the signals ratio being 1.27 and the number per cell 2.10. ° °The patient's subsequent history is as detailed below ° ° °PAST MEDICAL HISTORY: °Past Medical History:  °Diagnosis Date  °• Arthritis   ° neck  °• Cancer (HCC) 07/2016  ° right breast cancer  °• Depression   °• Family history of adverse reaction to anesthesia   ° mother had cardiac arrest and PONV  °• Headache   ° migraine  °• History of kidney stones   °• No pertinent past medical history   ° ° °PAST SURGICAL HISTORY: °Past Surgical History:  °Procedure Laterality Date  °• BACK SURGERY    ° diskectomy L5-6  °• BREAST LUMPECTOMY WITH RADIOACTIVE SEED AND SENTINEL LYMPH NODE BIOPSY Right 09/03/2016  ° Procedure: RIGHT BREAST LUMPECTOMY WITH RADIOACTIVE SEED AND RIGHT AXILLARY SENTINEL NODE BIOPSY;  Surgeon: Wakefield, Matthew, MD;  Location: [facility];  Service: General;  Laterality: Right;  °• CESAREAN SECTION    ° triplets  °• DILATION AND CURETTAGE OF UTERUS    °• KNEE ARTHROSCOPY Right   °• RE-EXCISION OF BREAST LUMPECTOMY Right 09/11/2016  ° Procedure: RIGHT RE-EXCISION OF BREAST LUMPECTOMY;  Surgeon: Wakefield, Matthew, MD;  Location: [facility];  Service: General;  Laterality: Right;  °• TOTAL KNEE ARTHROPLASTY Right 03/16/2019  ° Procedure: TOTAL KNEE ARTHROPLASTY;  Surgeon: Aluisio, Frank, MD;  Location: [facility];  Service: Orthopedics;  Laterality: Right;  50min  ° ° °  34mn    FAMILY HISTORY Family History  Problem Relation Age of Onset   Breast cancer Paternal Aunt    Breast cancer Cousin   The patient's father died from complications of emphysema at age 68 The patient's mother died following a stroke at age 68 The patient  had no brothers, 1 sister. A paternal aunt had breast cancer in her 870sand a paternal second cousin breast cancer in her 659s There is no history of ovarian cancer in the family   GYNECOLOGIC HISTORY:  No LMP recorded. Patient is postmenopausal. Menarche age 89 first live birth age 69 The patient is GX P3. She stopped having periods in 2004. She took hormone replacement until 2018, when she was diagnosed with breast cancer.   SOCIAL HISTORY:  [employer] as dElectrical engineerin the [employer] Her husband CMarijo Fileis a cGames developer Her children, from a prior marriage, are WSalvatore Marvelwho [place] in [place] is an eChief Financial Officer AHarlan Stains who is completing her Spanish literature [facility] and will be working in a 10-year track position at [employer](and [place], and KMelia who works in [place]    ADVANCED DIRECTIVES: The patient tells me she has named her daughter AVincente Libertyas her healthcare power of attorney. AVincente Libertycan be contacted at [phone]   HEALTH MAINTENANCE: Social History   Tobacco Use   Smoking status: Never Smoker   Smokeless tobacco: Never Used  VScientific laboratory technicianUse: Never used  Substance Use Topics   Alcohol use: Yes    Comment: occ   Drug use: No     Colonoscopy: 2016/[facility]     PAP:  Bone density: 2013   Allergies  Allergen Reactions   Darvon [Propoxyphene Hcl] Other (See Comments)    hyperactivity   Penicillins Itching    Has patient had a PCN reaction causing immediate rash, facial/tongue/throat swelling, SOB or lightheadedness with hypotension: No Has patient had a PCN reaction causing severe rash involving mucus membranes or skin necrosis: No Has patient had a PCN reaction that required hospitalization: No Has patient had a PCN reaction occurring within the last 10 years: No If all of the above answers are "NO", then may proceed  with Cephalosporin use.     Current Outpatient Medications  Medication Sig Dispense Refill   clonazePAM (KLONOPIN) 0.5 MG tablet TAKE 1-2 TABLETS BY MOUTH AT BEDTIME AS NEEDED FOR SLEEP (Patient taking differently: Take 0.5-1 mg by mouth at bedtime. ) 60 tablet 0   gabapentin (NEURONTIN) 300 MG capsule TAKE 2 CAPSULES BY MOUTH AT BEDTIME 120 capsule 6   methocarbamol (ROBAXIN) 500 MG tablet Take 1 tablet (500 mg total) by mouth every 6 (six) hours as needed for muscle spasms. 40 tablet 0   oxyCODONE (OXY IR/ROXICODONE) 5 MG immediate release tablet Take 1-2 tablets (5-10 mg total) by mouth every 6 (six) hours as needed for severe pain. 30 tablet 0   rivaroxaban (XARELTO) 10 MG TABS tablet Take 1 tablet (10 mg total) by mouth daily with breakfast for 20 days. Then take one 81 mg aspirin once a day for three weeks. Then discontinue aspirin. 20 tablet 0   sertraline (ZOLOFT) 100 MG tablet Take 150 mg by mouth daily.     tamoxifen (NOLVADEX) 20 MG tablet Take 1 tablet (20 mg total) by mouth daily. 90 tablet 4   No current facility-administered medications  OBJECTIVE: white woman in no acute distress ° °Vitals:  ° 10/12/19 0957  °BP: (!) 111/56  °Pulse: 66  °Resp: 18  °Temp: 98.3 °F (36.8 °C)  °SpO2: 98%  °   Body mass index is 24.55 kg/m².    ECOG FS:0 - Asymptomatic ° °Sclerae unicteric, EOMs intact °Wearing a mask °No cervical or supraclavicular adenopathy °Lungs no rales or rhonchi °Heart regular rate and rhythm °Abd soft, nontender, positive bowel sounds °MSK no focal spinal tenderness, no upper extremity lymphedema °Neuro: nonfocal, well oriented, appropriate affect °Breasts: The right breast is status post lumpectomy and radiation.  The cosmetic result is excellent.  There is no evidence of local recurrence.  The left breast is benign.  Both axillae are benign. ° ° °LAB RESULTS: ° °CMP  °   °Component Value Date/Time  ° NA 138 03/17/2019 0311  ° NA 143 08/22/2016 0831  ° K  4.2 03/17/2019 0311  ° K 4.3 08/22/2016 0831  ° CL 105 03/17/2019 0311  ° CO2 24 03/17/2019 0311  ° CO2 26 08/22/2016 0831  ° GLUCOSE 127 (H) 03/17/2019 0311  ° GLUCOSE 87 08/22/2016 0831  ° BUN 9 03/17/2019 0311  ° BUN 8.7 08/22/2016 0831  ° CREATININE 0.80 03/17/2019 0311  ° CREATININE 0.90 03/05/2018 1455  ° CREATININE 0.9 08/22/2016 0831  ° CALCIUM 8.3 (L) 03/17/2019 0311  ° CALCIUM 9.3 08/22/2016 0831  ° PROT 6.9 03/12/2019 1030  ° PROT 7.1 08/22/2016 0831  ° ALBUMIN 4.2 03/12/2019 1030  ° ALBUMIN 4.3 08/22/2016 0831  ° AST 28 03/12/2019 1030  ° AST 33 03/05/2018 1455  ° AST 29 08/22/2016 0831  ° ALT 27 03/12/2019 1030  ° ALT 44 03/05/2018 1455  ° ALT 29 08/22/2016 0831  ° ALKPHOS 61 03/12/2019 1030  ° ALKPHOS 85 08/22/2016 0831  ° BILITOT 0.4 03/12/2019 1030  ° BILITOT 0.2 (L) 03/05/2018 1455  ° BILITOT 0.35 08/22/2016 0831  ° GFRNONAA >60 03/17/2019 0311  ° GFRNONAA >60 03/05/2018 1455  ° GFRAA >60 03/17/2019 0311  ° GFRAA >60 03/05/2018 1455  ° ° °No results found for: TOTALPROTELP, ALBUMINELP, A1GS, A2GS, BETS, BETA2SER, GAMS, MSPIKE, SPEI ° °No results found for: KPAFRELGTCHN, LAMBDASER, KAPLAMBRATIO ° °Lab Results  °Component Value Date  ° WBC 5.1 10/12/2019  ° NEUTROABS 2.2 10/12/2019  ° HGB 12.8 10/12/2019  ° HCT 39.3 10/12/2019  ° MCV 91.2 10/12/2019  ° PLT 153 10/12/2019  ° ° °  Chemistry   °   °Component Value Date/Time  ° NA 138 03/17/2019 0311  ° NA 143 08/22/2016 0831  ° K 4.2 03/17/2019 0311  ° K 4.3 08/22/2016 0831  ° CL 105 03/17/2019 0311  ° CO2 24 03/17/2019 0311  ° CO2 26 08/22/2016 0831  ° BUN 9 03/17/2019 0311  ° BUN 8.7 08/22/2016 0831  ° CREATININE 0.80 03/17/2019 0311  ° CREATININE 0.90 03/05/2018 1455  ° CREATININE 0.9 08/22/2016 0831  °    °Component Value Date/Time  ° CALCIUM 8.3 (L) 03/17/2019 0311  ° CALCIUM 9.3 08/22/2016 0831  ° ALKPHOS 61 03/12/2019 1030  ° ALKPHOS 85 08/22/2016 0831  ° AST 28 03/12/2019 1030  ° AST 33 03/05/2018 1455  ° AST 29 08/22/2016 0831  ° ALT 27  03/12/2019 1030  ° ALT 44 03/05/2018 1455  ° ALT 29 08/22/2016 0831  ° BILITOT 0.4 03/12/2019 1030  ° BILITOT 0.2 (L) 03/05/2018 1455  ° BILITOT 0.35 08/22/2016 0831  °  ° ° ° °No results found   No results found for: LABCA2  No components found for: [id number]  No results for input(s): INR in the last 168 hours.  Urinalysis No results found for: COLORURINE, APPEARANCEUR, LABSPEC, PHURINE, GLUCOSEU, HGBUR, BILIRUBINUR, KETONESUR, PROTEINUR, UROBILINOGEN, NITRITE, LEUKOCYTESUR   STUDIES: No results found.   ELIGIBLE FOR AVAILABLE RESEARCH PROTOCOL: No  ASSESSMENT: 69 y.o. [place] woman status post right breast lower inner quadrant biopsy 08/13/2016 for a clinical T1a N0, stage IA invasive ductal carcinoma, grade 1, estrogen and progesterone receptor positive, HER-2 not amplified, with an MIB-1 of 2%  (1) status post right lumpectomy and sentinel lymph node sampling 09/03/2016 for a pT1b pN0, stage IA invasive ductal carcinoma, grade 1, with a broadly positive anterior margin   (a) margins cleared with additional surgery 09/11/2016  (2) adjuvant radiation6/20/2018 to 11/07/2016 Site/dose:    1. The Right breast was treated to 42.5 Gy in 17 fractions at 2.5 Gy per fraction. 2. The Right breast was boosted to 7.5 Gy in 3 fractions at 2.5 Gy per fraction.  (3) Tamoxifen started 12/25/2016   PLAN: Rosmarie is now 3 years out from definitive surgery for her breast cancer with no evidence of disease recurrence.  This is very favorable.  She is tolerating tamoxifen moderately well.  She does have significant hot flashes.  At this point she is willing to continue.  She does benefit from gabapentin in the evening.  She has an excellent exercise program  She did well with the Marceline vaccine.  She will see me again in 1 year after her next mammography.  She knows to call for any other issue that may develop before then  Total encounter time 20 minutes.*  [place] Fant, Virgie Dad, MD  10/12/19 10:06  AM Medical Oncology and Hematology [facility] [place], [place] [address] Tel. [phone]    Fax. [phone]    I, Victoria [name], am acting as scribe for Dr. Virgie Dad. Victoria [name].  I, Lurline Del MD, have reviewed the above documentation for accuracy and completeness, and I agree with the above.   *Total Encounter Time as defined by the Centers for Medicare and Medicaid Services includes, in addition to the face-to-face time of a patient visit (documented in the note above) non-face-to-face time: obtaining and reviewing outside history, ordering and reviewing medications, tests or procedures, care coordination (communications with other health care professionals or caregivers) and documentation in the medical record.

## 2019-10-12 ENCOUNTER — Inpatient Hospital Stay: Payer: Medicare Other | Attending: Oncology

## 2019-10-12 ENCOUNTER — Inpatient Hospital Stay: Payer: Medicare Other | Admitting: Oncology

## 2019-10-12 ENCOUNTER — Telehealth: Payer: Self-pay | Admitting: Oncology

## 2019-10-12 ENCOUNTER — Other Ambulatory Visit: Payer: Self-pay

## 2019-10-12 VITALS — BP 111/56 | HR 66 | Temp 98.3°F | Resp 18 | Ht 63.0 in | Wt 138.6 lb

## 2019-10-12 DIAGNOSIS — Z87442 Personal history of urinary calculi: Secondary | ICD-10-CM | POA: Insufficient documentation

## 2019-10-12 DIAGNOSIS — F329 Major depressive disorder, single episode, unspecified: Secondary | ICD-10-CM | POA: Insufficient documentation

## 2019-10-12 DIAGNOSIS — Z170 Estrogen receptor positive status [ER+]: Secondary | ICD-10-CM | POA: Diagnosis not present

## 2019-10-12 DIAGNOSIS — M129 Arthropathy, unspecified: Secondary | ICD-10-CM | POA: Insufficient documentation

## 2019-10-12 DIAGNOSIS — R232 Flushing: Secondary | ICD-10-CM | POA: Insufficient documentation

## 2019-10-12 DIAGNOSIS — Z7901 Long term (current) use of anticoagulants: Secondary | ICD-10-CM | POA: Insufficient documentation

## 2019-10-12 DIAGNOSIS — Z79810 Long term (current) use of selective estrogen receptor modulators (SERMs): Secondary | ICD-10-CM | POA: Insufficient documentation

## 2019-10-12 DIAGNOSIS — C50311 Malignant neoplasm of lower-inner quadrant of right female breast: Secondary | ICD-10-CM | POA: Diagnosis not present

## 2019-10-12 DIAGNOSIS — Z79899 Other long term (current) drug therapy: Secondary | ICD-10-CM | POA: Insufficient documentation

## 2019-10-12 DIAGNOSIS — Z803 Family history of malignant neoplasm of breast: Secondary | ICD-10-CM | POA: Diagnosis not present

## 2019-10-12 LAB — COMPREHENSIVE METABOLIC PANEL
ALT: 27 U/L (ref 0–44)
AST: 29 U/L (ref 15–41)
Albumin: 3.9 g/dL (ref 3.5–5.0)
Alkaline Phosphatase: 83 U/L (ref 38–126)
Anion gap: 10 (ref 5–15)
BUN: 14 mg/dL (ref 8–23)
CO2: 26 mmol/L (ref 22–32)
Calcium: 9 mg/dL (ref 8.9–10.3)
Chloride: 105 mmol/L (ref 98–111)
Creatinine, Ser: 0.86 mg/dL (ref 0.44–1.00)
GFR calc Af Amer: >60 mL/min (ref >60)
GFR calc non Af Amer: >60 mL/min (ref >60)
Glucose, Bld: 92 mg/dL (ref 70–99)
Potassium: 3.9 mmol/L (ref 3.5–5.1)
Sodium: 141 mmol/L (ref 135–145)
Total Bilirubin: 0.4 mg/dL (ref 0.3–1.2)
Total Protein: 6.6 g/dL (ref 6.5–8.1)

## 2019-10-12 LAB — CBC WITH DIFFERENTIAL/PLATELET
Abs Immature Granulocytes: 0.01 10*3/uL (ref 0.00–0.07)
Basophils Absolute: 0 10*3/uL (ref 0.0–0.1)
Basophils Relative: 1 %
Eosinophils Absolute: 0.1 10*3/uL (ref 0.0–0.5)
Eosinophils Relative: 2 %
HCT: 39.3 % (ref 36.0–46.0)
Hemoglobin: 12.8 g/dL (ref 12.0–15.0)
Immature Granulocytes: 0 %
Lymphocytes Relative: 42 %
Lymphs Abs: 2.1 10*3/uL (ref 0.7–4.0)
MCH: 29.7 pg (ref 26.0–34.0)
MCHC: 32.6 g/dL (ref 30.0–36.0)
MCV: 91.2 fL (ref 80.0–100.0)
Monocytes Absolute: 0.7 10*3/uL (ref 0.1–1.0)
Monocytes Relative: 13 %
Neutro Abs: 2.2 10*3/uL (ref 1.7–7.7)
Neutrophils Relative %: 42 %
Platelets: 153 10*3/uL (ref 150–400)
RBC: 4.31 MIL/uL (ref 3.87–5.11)
RDW: 12.9 % (ref 11.5–15.5)
WBC: 5.1 10*3/uL (ref 4.0–10.5)
nRBC: 0 % (ref 0.0–0.2)

## 2019-10-12 MED ORDER — TAMOXIFEN CITRATE 20 MG PO TABS: 20.0000 mg | ORAL_TABLET | Freq: Every day | ORAL | 4 refills | Status: DC

## 2019-10-12 MED ORDER — GABAPENTIN 300 MG PO CAPS: 600.0000 mg | ORAL_CAPSULE | Freq: Every day | ORAL | 6 refills | Status: DC

## 2019-10-12 NOTE — Telephone Encounter (Signed)
Scheduled appts pe 6/21 los. Gave pt a print out of AVS.

## 2019-11-06 ENCOUNTER — Encounter: Payer: Medicare Other | Attending: Family Medicine | Admitting: Registered"

## 2019-11-06 NOTE — Progress Notes (Unsigned)
Pt attended weigh in session for the Diabetes Prevention Program and does not meet BMI requirement of 25 or greater for enrollment. Discussed with pt that she is welcome to attend virtual sessions to receive content even though she cannot be official part of program. Pt would like to attend virtual sessions. Discussed that working toward wt loss goal is not recommended due to pt's current wt being under that for program. Pt reports understanding.

## 2019-11-13 ENCOUNTER — Encounter: Payer: Medicare Other | Admitting: Registered"

## 2019-11-13 ENCOUNTER — Other Ambulatory Visit: Payer: Self-pay

## 2020-01-15 ENCOUNTER — Encounter: Payer: Self-pay | Admitting: Registered"

## 2020-01-15 ENCOUNTER — Encounter: Payer: Medicare Other | Attending: Family Medicine | Admitting: Registered"

## 2020-01-15 DIAGNOSIS — R7303 Prediabetes: Secondary | ICD-10-CM

## 2020-01-15 NOTE — Progress Notes (Signed)
On 01/15/20 patient attended/audited Core Session 10 of Diabetes Prevention Program course virtually with [facility]. The following learning objectives were met by the patient during this class:   I connected with Victoria [name] by a video enabled application and verified that I am speaking with the correct person using two identifiers.  Location: Patient: Patient's home.  Provider: [facility] Office   Learning Objectives:  List and describe the four keys for healthy eating out.   Give examples of how to apply these keys at the type of restaurants that the participants go to regularly.   Make an appropriate meal selection from a restaurant menu.   Demonstrate how to ask for a substitute item using assertive language and a polite tone of voice.    Goals:   Record weight taken outside of class.   Track foods and beverages eaten each day in the "Food and Activity Tracker," including calories and fat grams for each item.    Track activity type, minutes you were active, and distance you reached each day in the "Food and Activity Tracker."   Set aside one 20 to 30-minute block of time every day or find two or more periods of 10 to15 minutes each for physical activity.   Utilize positive action plan and complete questions on "To Do List."   Follow-Up Plan:  Attend Core Session 11.   Email completed "Food and Activity Tracker" to be reviewed by Lifestyle Coach.

## 2020-01-26 DIAGNOSIS — M79672 Pain in left foot: Secondary | ICD-10-CM | POA: Insufficient documentation

## 2020-02-10 DIAGNOSIS — S92353A Displaced fracture of fifth metatarsal bone, unspecified foot, initial encounter for closed fracture: Secondary | ICD-10-CM | POA: Insufficient documentation

## 2020-03-11 ENCOUNTER — Encounter: Payer: Medicare Other | Attending: Family Medicine | Admitting: Registered"

## 2020-03-11 ENCOUNTER — Encounter: Payer: Self-pay | Admitting: Registered"

## 2020-03-11 DIAGNOSIS — R7303 Prediabetes: Secondary | ICD-10-CM | POA: Insufficient documentation

## 2020-03-11 NOTE — Progress Notes (Signed)
On 03/11/20 patient completed Core Session 14 of Diabetes Prevention Program course virtually with [facility]. By the end of this session patients are able to complete the following objectives:   I connected with [name] by a video enabled application and verified that I am speaking with the correct person.  Location: Patient: Home.  Provider: Office.   Learning Objectives:  Give examples of problem social cues and helpful social cues.   Explain how to remove problem social cues and add helpful ones.   Describe ways of coping with vacations and social events such as parties, holidays, and visits from relatives and friends.   Create an action plan to change a problem social cue and add a helpful one.   Goals:   Record weight taken outside of class.   Track foods and beverages eaten each day in the "Food and Activity Tracker," including calories and fat grams for each item.    Track activity type, minutes you were active, and distance you reached each day in the "Food and Activity Tracker."   Do your best to reach activity goal for the week.  Use action plan created during session to change a problem social cue and add a helpful social cue.   Answer questions regarding success of changing social cues on "To Do Next Week" handout.   Follow-Up Plan:  Attend Core Session 15 next week.   Email completed "Food and Activity Tracker" before next week to be reviewed by Lifestyle Coach.

## 2020-03-25 ENCOUNTER — Encounter: Payer: Medicare Other | Admitting: Registered"

## 2020-04-01 ENCOUNTER — Encounter: Payer: Medicare Other | Attending: Family Medicine | Admitting: Registered"

## 2020-04-01 ENCOUNTER — Encounter: Payer: Self-pay | Admitting: Registered"

## 2020-04-01 DIAGNOSIS — R7303 Prediabetes: Secondary | ICD-10-CM | POA: Insufficient documentation

## 2020-04-01 NOTE — Progress Notes (Signed)
On 04/01/20 patient completed Core Session 16 of Diabetes Prevention Program course virtually with [facility]. By the end of this session patients are able to complete the following objectives:   I connected with [name] by a video enabled application and verified that I am speaking with the correct person.  Location: Patient: Home.  Provider: [place].   Learning Objectives:  Measure their progress toward weight and physical activity goals since Session 1.   Develop a plan for improving progress, if their goals have not yet been attained.   Describe ways to stay motivated long-term.   Goals:   Record weight taken outside of class.   Track foods and beverages eaten each day in the "Food and Activity Tracker," including calories and fat grams for each item.    Track activity type, minutes you were active, and distance you reached each day in the "Food and Activity Tracker."   Utilize action plan to help stay motivated and complete questions on "To Do List."   Follow-Up Plan:  Attend session.   Email completed "Food and Activity Tracker" before next session to be reviewed by Lifestyle Coach.

## 2020-04-29 ENCOUNTER — Encounter: Payer: Self-pay | Admitting: Registered"

## 2020-04-29 ENCOUNTER — Encounter: Payer: Medicare Other | Attending: Family Medicine | Admitting: Registered"

## 2020-04-29 DIAGNOSIS — R7303 Prediabetes: Secondary | ICD-10-CM | POA: Insufficient documentation

## 2020-04-29 NOTE — Progress Notes (Addendum)
On 04/29/20 patient completed a post core session of the Diabetes Prevention Program course virtually with [facility]. By the end of this session patients are able to complete the following objectives:   Virtual Visit via Video Note  I connected with [name] by a video enabled application and verified that I am speaking with the correct person.  Location: Patient: Home.  Provider: Office.   Learning Objectives:  Identify how to maintain and/or continue working toward program goals for the remainder of the program.   Describe ways that food and activity tracking can assist them in maintaining/reaching program goals.   Identify progress they have made since the beginning of the program.   Goals:   Record weight taken outside of class.   Track foods and beverages eaten each day in the "Food and Activity Tracker," including calories and fat grams for each item.    Track activity type, minutes you were active, and distance you reached each day in the "Food and Activity Tracker."   Follow-Up Plan: . Attend next session.  . Email completed "Food and Activity Trackers" before next session to be reviewed by Lifestyle Coach.

## 2020-05-02 DIAGNOSIS — Z1152 Encounter for screening for COVID-19: Secondary | ICD-10-CM | POA: Diagnosis not present

## 2020-05-13 ENCOUNTER — Encounter: Payer: Medicare Other | Admitting: Registered"

## 2020-05-21 ENCOUNTER — Other Ambulatory Visit: Payer: Self-pay | Admitting: Oncology

## 2020-05-27 ENCOUNTER — Encounter: Payer: Medicare Other | Attending: Family Medicine | Admitting: Registered"

## 2020-05-27 ENCOUNTER — Encounter: Payer: Self-pay | Admitting: Registered"

## 2020-05-27 DIAGNOSIS — R7303 Prediabetes: Secondary | ICD-10-CM | POA: Insufficient documentation

## 2020-05-27 NOTE — Progress Notes (Signed)
On 05/27/20 patient completed a post core session of the Diabetes Prevention Program course virtually with [facility]. By the end of this session patients are able to complete the following objectives:   I connected with [name] by a video enabled application and verified that I am speaking with the correct person.  Location: Patient: Home  Provider: Office.   Learning Objectives:  Describe the importance of having regular meals each day and how skipping meals can negatively affect food choices and weight.   Plan out balanced meals and snacks.  List ways to avoid unplanned snacking.   Goals:   Record weight taken outside of class.   Track foods and beverages eaten each day in the "Food and Activity Tracker," including calories and fat grams for each item.    Track activity type, minutes you were active, and distance you reached each day in the "Food and Activity Tracker."   Follow-Up Plan:  Attend next session.   Email completed "Food and Activity Trackers" before next session to be reviewed by Lifestyle Coach.

## 2020-06-10 ENCOUNTER — Encounter: Payer: Medicare Other | Admitting: Registered"

## 2020-06-24 ENCOUNTER — Encounter: Payer: Medicare Other | Attending: Family Medicine | Admitting: Registered"

## 2020-06-24 DIAGNOSIS — R7303 Prediabetes: Secondary | ICD-10-CM | POA: Insufficient documentation

## 2020-06-26 ENCOUNTER — Encounter: Payer: Self-pay | Admitting: Registered"

## 2020-06-26 NOTE — Progress Notes (Signed)
On 06/24/20 patient completed a post core session of the Diabetes Prevention Program course virtually with [facility]. By the end of this session patients are able to complete the following objectives:   I connected with Victoria [name] by a video enabled application and verified that I am speaking with the correct person.  Location: Patient: Home.  Provider: Office.   Learning Objectives:  Describe how to incorporate more fruits and vegetables into meals.  List criteria for selecting good quality fruits and vegetables at the store.   Define mindful eating.  List the benefits of eating mindfully.   Goals:   Record weight taken outside of class.   Track foods and beverages eaten each day in the "Food and Activity Tracker," including calories and fat grams for each item.    Track activity type, minutes you were active, and distance you reached each day in the "Food and Activity Tracker."   Follow-Up Plan:  Attend next session.   Email completed "Food and Activity Trackers" before next session to be reviewed by Lifestyle Coach.

## 2020-07-22 ENCOUNTER — Encounter: Payer: Self-pay | Admitting: Registered"

## 2020-07-22 ENCOUNTER — Encounter: Payer: Medicare Other | Attending: Family Medicine | Admitting: Registered"

## 2020-07-22 DIAGNOSIS — R7303 Prediabetes: Secondary | ICD-10-CM | POA: Insufficient documentation

## 2020-07-22 NOTE — Progress Notes (Signed)
On 07/22/20 patient completed a post core session of the Diabetes Prevention Program course virtually with [facility]. By the end of this session patients are able to complete the following objectives:   Virtual Visit via Video Note  I connected with Victoria [name] on 07/22/20 at 10:00 AM EDT by a video enabled application and verified that I am speaking with the correct person using two identifiers.  Location: Patient: Home.  Provider: Office.   Learning Objectives:  Describe the difference between a lapse and a relapse.  List steps to prevent a lapse from becoming a relapse.   Identify situations that increase risk of having a lapse.   Make a plan to help prevent lapses and recover after a lapse has occurred.   Goals:   Record weight taken outside of class.   Track foods and beverages eaten each day in the "Food and Activity Tracker," including calories and fat grams for each item.    Track activity type, minutes you were active, and distance you reached each day in the "Food and Activity Tracker."   Follow-Up Plan:  Attend next session.   Email completed "Food and Activity Trackers" before next session to be reviewed by Lifestyle Coach.

## 2020-07-26 DIAGNOSIS — H2513 Age-related nuclear cataract, bilateral: Secondary | ICD-10-CM | POA: Diagnosis not present

## 2020-08-26 ENCOUNTER — Encounter: Payer: Medicare Other | Admitting: Registered"

## 2020-09-23 ENCOUNTER — Encounter: Payer: Medicare Other | Admitting: Registered"

## 2020-10-19 NOTE — Progress Notes (Signed)
[place]  [phone] Fax:(336) [phone]     ID: Victoria Victoria [name] DOB: 1950-07-06  MR#: [id number]  XUX#:[id number]  Patient Care Team: Victoria Victoria [name], Victoria [name] as PCP - General (Family Medicine) Victoria Victoria [name], Victoria Victoria [name], Victoria [name] as Consulting Physician (Oncology) Victoria Victoria [name], Victoria [name] as Consulting Physician (General Surgery) Victoria Victoria [name], Victoria [name] as Consulting Physician (Radiation Oncology) Victoria Victoria [name], Victoria [name] as Physician Assistant (Radiology) Victoria Victoria [name], Victoria Massed, NP as Nurse Practitioner (Hematology and Oncology) OTHER Victoria [name]:   CHIEF COMPLAINT: Estrogen receptor positive breast cancer  CURRENT TREATMENT: Tamoxifen   INTERVAL HISTORY: Victoria Victoria [name] returns today for follow-up of her estrogen receptor positive breast cancer.   She continues on tamoxifen.  She has about 5 hot flashes daily, with some sweating, which is quite uncomfortable.  Vaginal wetness is not an issue.  The nighttime hot flashes are well controlled on gabapentin  She is due for a diagnostic mammogram scheduled 10/25/2020 at [facility].   REVIEW OF SYSTEMS: Victoria Victoria [name] spent a month in [place] with her husband and had a great time and then tested positive for COVID just before coming so she had to spend an extra 10 days.  She exercises by walking and working in her garden.  They also did a nice kayaking trip in [place].  A detailed review of systems today was otherwise entirely benign.   COVID 19 VACCINATION STATUS: [place] x2, status post booster x2, also had COVID February 2022   BREAST CANCER HISTORY: From the original intake note:  Victoria Victoria [name] had routine bilateral screening mammography at [facility] 07/20/2016. The breast density was category B. In the right breast lower inner quadrant there was an oval mass associated with architectural distortion. On 08/02/2016 the patient was brought back for further imaging. Right diagnostic mammography confirmed a 0.5 cm all nodule in the right breast at the 4:00 radiant. By  ultrasonography this measured 0.4 cm and had lobulated margins.  Biopsy of the right breast mass in question 08/13/2016 showed ([id number]) invasive ductal carcinoma, grade 1, estrogen receptor 100% positive, progesterone receptor 100% positive, both with strong staining intensity, with an MIB-1 of 2%, and no HER-2 notification, the signals ratio being 1.27 and the number per cell 2.10.  The patient's subsequent history is as detailed below   PAST MEDICAL HISTORY: Past Medical History:  Diagnosis Date   Arthritis    neck   Cancer ([place]) 07/2016   right breast cancer   Depression    Family history of adverse reaction to anesthesia    mother had cardiac arrest and PONV   Headache    migraine   History of kidney stones    No pertinent past medical history     PAST SURGICAL HISTORY: Past Surgical History:  Procedure Laterality Date   BACK SURGERY     diskectomy L5-6   BREAST LUMPECTOMY WITH RADIOACTIVE SEED AND SENTINEL LYMPH NODE BIOPSY Right 09/03/2016   Procedure: RIGHT BREAST LUMPECTOMY WITH RADIOACTIVE SEED AND RIGHT AXILLARY SENTINEL NODE BIOPSY;  Surgeon: Victoria Victoria [name], Victoria [name];  Location: [place];  Service: General;  Laterality: Right;   CESAREAN SECTION     triplets   DILATION AND CURETTAGE OF UTERUS     KNEE ARTHROSCOPY Right    RE-EXCISION OF BREAST LUMPECTOMY Right 09/11/2016   Procedure: RIGHT RE-EXCISION OF BREAST LUMPECTOMY;  Surgeon: Victoria Victoria [name], Victoria [name];  Location: [place];  Service: General;  Laterality: Right;   TOTAL KNEE ARTHROPLASTY Right 03/16/2019   Procedure: TOTAL KNEE ARTHROPLASTY;  Surgeon: Gaynelle Arabian, Victoria [name];  Location:  [facility];  Service: Orthopedics;  Laterality: Right;  33mn    FAMILY HISTORY Family History  Problem Relation Age of Onset   Breast cancer Paternal Aunt    Breast cancer Cousin   The patient's father died from complications of emphysema at age 71029 The patient's mother died following a stroke at age 710253 The patient  had no brothers, 1 sister. A paternal aunt had breast cancer in her 874sand a paternal second cousin breast cancer in her 663s There is no history of ovarian cancer in the family   GYNECOLOGIC HISTORY:  No LMP recorded. Patient is postmenopausal. Menarche age 70 first live birth age 70 The patient is GX P3. She stopped having periods in 2004. She took hormone replacement until 2018, when she was diagnosed with breast cancer.   SOCIAL HISTORY:  [employer] as dElectrical engineerin the [employer] Her husband CMarijo Victoria [name] a cGames developer Her children, from a prior marriage, are WSalvatore Marvelwho [place] in [place] is an eChief Financial Officer AHarlan Victoria [name] who is completing her Spanish literature PhD program [facility] 2019 and will be working in a 10-year track position at [employer](and whose [place], and K[place]a who works in [place]    ADVANCED DIRECTIVES: The patient tells me she has named her daughter AVincente Libertyas her healthcare power of attorney. AVincente Libertycan be contacted at [phone]   HEALTH MAINTENANCE: Social History   Tobacco Use   Smoking status: Never   Smokeless tobacco: Never  Vaping Use   Vaping Use: Never used  Substance Use Topics   Alcohol use: Yes    Comment: occ   Drug use: No     Colonoscopy: 2016/[facility]     PAP:  Bone density: 2013   Allergies  Allergen Reactions   Darvon [Propoxyphene Hcl] Other (See Comments)    hyperactivity   Penicillins Itching    Has patient had a PCN reaction causing immediate rash, facial/tongue/throat swelling, SOB or lightheadedness with hypotension: No Has patient had a PCN reaction causing severe rash involving mucus membranes or skin necrosis: No Has patient had a PCN reaction that required hospitalization: No Has patient had a PCN reaction occurring within the last 10 years: No If all of the above answers are "NO", then may proceed with Cephalosporin  use.     Current Outpatient Medications  Medication Sig Dispense Refill   clonazePAM (KLONOPIN) 0.5 MG tablet TAKE 1-2 TABLETS BY MOUTH AT BEDTIME AS NEEDED FOR SLEEP (Patient taking differently: Take 0.5-1 mg by mouth at bedtime. ) 60 tablet 0   gabapentin (NEURONTIN) 300 MG capsule Take 2 capsules (600 mg total) by mouth at bedtime. 120 capsule 6   sertraline (ZOLOFT) 100 MG tablet Take 150 mg by mouth daily.     tamoxifen (NOLVADEX) 20 MG tablet Take 1 tablet (20 mg total) by mouth daily. 90 tablet 4   No current facility-administered medications for this visit.    OBJECTIVE: white woman who appears well Vitals:   10/20/20 0957  BP: (!) 99/55  Pulse: 72  Resp: 18  Temp: 97.7 F (36.5 C)  SpO2: 96%      Body mass index is 24.29 kg/m.    ECOG FS:0 - Asymptomatic  Sclerae unicteric, EOMs intact Wearing a mask No cervical or supraclavicular adenopathy Lungs no rales or rhonchi Heart regular rate and rhythm Abd soft, nontender, positive bowel sounds MSK no focal spinal  tenderness, no upper extremity lymphedema Neuro: nonfocal, well oriented, appropriate affect Breasts: The right breast has undergone lumpectomy and radiation.  The left breast is benign.  Both axillae are benign   LAB RESULTS:  CMP     Component Value Date/Time   NA 139 10/20/2020 0937   NA 143 08/22/2016 0831   K 4.2 10/20/2020 0937   K 4.3 08/22/2016 0831   CL 108 10/20/2020 0937   CO2 24 10/20/2020 0937   CO2 26 08/22/2016 0831   GLUCOSE 95 10/20/2020 0937   GLUCOSE 87 08/22/2016 0831   BUN 9 10/20/2020 0937   BUN 8.7 08/22/2016 0831   CREATININE 0.78 10/20/2020 0937   CREATININE 0.90 03/05/2018 1455   CREATININE 0.9 08/22/2016 0831   CALCIUM 8.8 (L) 10/20/2020 0937   CALCIUM 9.3 08/22/2016 0831   PROT 6.7 10/20/2020 0937   PROT 7.1 08/22/2016 0831   ALBUMIN 3.7 10/20/2020 0937   ALBUMIN 4.3 08/22/2016 0831   AST 24 10/20/2020 0937   AST 33 03/05/2018 1455   AST 29 08/22/2016 0831    ALT 24 10/20/2020 0937   ALT 44 03/05/2018 1455   ALT 29 08/22/2016 0831   ALKPHOS 92 10/20/2020 0937   ALKPHOS 85 08/22/2016 0831   BILITOT 0.2 (L) 10/20/2020 0937   BILITOT 0.2 (L) 03/05/2018 1455   BILITOT 0.35 08/22/2016 0831   GFRNONAA >60 10/20/2020 0937   GFRNONAA >60 03/05/2018 1455   GFRAA >60 10/12/2019 0931   GFRAA >60 03/05/2018 1455    No results found for: Ronnald Ramp, A1GS, A2GS, BETS, BETA2SER, GAMS, MSPIKE, SPEI  No results found for: KPAFRELGTCHN, LAMBDASER, [facility]  Lab Results  Component Value Date   WBC 6.0 10/20/2020   NEUTROABS 2.6 10/20/2020   HGB 13.2 10/20/2020   HCT 39.6 10/20/2020   MCV 88.6 10/20/2020   PLT 180 10/20/2020      Chemistry      Component Value Date/Time   NA 139 10/20/2020 0937   NA 143 08/22/2016 0831   K 4.2 10/20/2020 0937   K 4.3 08/22/2016 0831   CL 108 10/20/2020 0937   CO2 24 10/20/2020 0937   CO2 26 08/22/2016 0831   BUN 9 10/20/2020 0937   BUN 8.7 08/22/2016 0831   CREATININE 0.78 10/20/2020 0937   CREATININE 0.90 03/05/2018 1455   CREATININE 0.9 08/22/2016 0831      Component Value Date/Time   CALCIUM 8.8 (L) 10/20/2020 0937   CALCIUM 9.3 08/22/2016 0831   ALKPHOS 92 10/20/2020 0937   ALKPHOS 85 08/22/2016 0831   AST 24 10/20/2020 0937   AST 33 03/05/2018 1455   AST 29 08/22/2016 0831   ALT 24 10/20/2020 0937   ALT 44 03/05/2018 1455   ALT 29 08/22/2016 0831   BILITOT 0.2 (L) 10/20/2020 0937   BILITOT 0.2 (L) 03/05/2018 1455   BILITOT 0.35 08/22/2016 0831       No results found for: LABCA2  No components found for: VEHMCN470  No results for input(s): INR in the last 168 hours.  Urinalysis No results found for: COLORURINE, APPEARANCEUR, LABSPEC, PHURINE, GLUCOSEU, HGBUR, BILIRUBINUR, KETONESUR, PROTEINUR, UROBILINOGEN, NITRITE, LEUKOCYTESUR   STUDIES: No results found.   ELIGIBLE FOR AVAILABLE RESEARCH PROTOCOL: No  ASSESSMENT: 70 y.o. [place] woman status post  right breast lower inner quadrant biopsy 08/13/2016 for a clinical T1a N0, stage IA invasive ductal carcinoma, grade 1, estrogen and progesterone receptor positive, HER-2 not amplified, with an MIB-1 of 2%  (1) status post  right lumpectomy and sentinel lymph node sampling 09/03/2016 for a pT1b pN0, stage IA invasive ductal carcinoma, grade 1, with a broadly positive anterior margin   (a) margins cleared with additional surgery 09/11/2016  (2) adjuvant radiation6/20/2018 to 11/07/2016  Site/dose:    1. The Right breast was treated to 42.5 Gy in 17 fractions at 2.5 Gy per fraction. 2. The Right breast was boosted to 7.5 Gy in 3 fractions at 2.5 Gy per fraction.   (3) Tamoxifen started 12/25/2016   PLAN: Ariea i is now a little over 4 years out from definitive surgery for her breast cancer with no evidence of disease recurrence.  This is very favorable.  She continues on tamoxifen generally with good tolerance.  She does depend on the gabapentin at night and when she ran out and [place] the hot flashes were unknown durable she says.  I have refilled both the tamoxifen and gabapentin for her for the next year.  When she seizes again a year from now she will be ready to "graduate".  She knows to call for any other issue that may develop before that visit  Total encounter time 20 minutes.*  Marilyne Haseley, Victoria Victoria [name], Victoria [name]  10/20/20 10:41 AM Medical Oncology and Hematology [facility] [place], [place] [address] Tel. [phone]    Fax. [phone]    I, Victoria Victoria [name], am acting as scribe for Dr. Virgie [name]. Victoria Victoria [name].  I, Victoria Victoria [name], have reviewed the above documentation for accuracy and completeness, and I agree with the above.   *Total Encounter Time as defined by the Centers for Medicare and Medicaid Services includes, in addition to the face-to-face time of a patient visit (documented in the note above) non-face-to-face time: obtaining and reviewing  outside history, ordering and reviewing medications, tests or procedures, care coordination (communications with other health care professionals or caregivers) and documentation in the medical record.

## 2020-10-20 ENCOUNTER — Inpatient Hospital Stay: Payer: Medicare Other | Attending: Oncology | Admitting: Oncology

## 2020-10-20 ENCOUNTER — Telehealth: Payer: Self-pay | Admitting: Oncology

## 2020-10-20 ENCOUNTER — Inpatient Hospital Stay: Payer: Medicare Other

## 2020-10-20 ENCOUNTER — Other Ambulatory Visit: Payer: Self-pay

## 2020-10-20 VITALS — BP 99/55 | HR 72 | Temp 97.7°F | Resp 18 | Ht 63.0 in | Wt 137.1 lb

## 2020-10-20 DIAGNOSIS — Z170 Estrogen receptor positive status [ER+]: Secondary | ICD-10-CM

## 2020-10-20 DIAGNOSIS — Z79810 Long term (current) use of selective estrogen receptor modulators (SERMs): Secondary | ICD-10-CM | POA: Insufficient documentation

## 2020-10-20 DIAGNOSIS — Z79899 Other long term (current) drug therapy: Secondary | ICD-10-CM | POA: Insufficient documentation

## 2020-10-20 DIAGNOSIS — M129 Arthropathy, unspecified: Secondary | ICD-10-CM | POA: Insufficient documentation

## 2020-10-20 DIAGNOSIS — C50311 Malignant neoplasm of lower-inner quadrant of right female breast: Secondary | ICD-10-CM

## 2020-10-20 DIAGNOSIS — C50211 Malignant neoplasm of upper-inner quadrant of right female breast: Secondary | ICD-10-CM | POA: Diagnosis not present

## 2020-10-20 DIAGNOSIS — R232 Flushing: Secondary | ICD-10-CM | POA: Diagnosis not present

## 2020-10-20 DIAGNOSIS — Z8616 Personal history of COVID-19: Secondary | ICD-10-CM | POA: Insufficient documentation

## 2020-10-20 DIAGNOSIS — Z87442 Personal history of urinary calculi: Secondary | ICD-10-CM | POA: Diagnosis not present

## 2020-10-20 DIAGNOSIS — Z803 Family history of malignant neoplasm of breast: Secondary | ICD-10-CM | POA: Insufficient documentation

## 2020-10-20 LAB — CBC WITH DIFFERENTIAL/PLATELET
Abs Immature Granulocytes: 0.01 10*3/uL (ref 0.00–0.07)
Basophils Absolute: 0.1 10*3/uL (ref 0.0–0.1)
Basophils Relative: 1 %
Eosinophils Absolute: 0.1 10*3/uL (ref 0.0–0.5)
Eosinophils Relative: 2 %
HCT: 39.6 % (ref 36.0–46.0)
Hemoglobin: 13.2 g/dL (ref 12.0–15.0)
Immature Granulocytes: 0 %
Lymphocytes Relative: 44 %
Lymphs Abs: 2.6 10*3/uL (ref 0.7–4.0)
MCH: 29.5 pg (ref 26.0–34.0)
MCHC: 33.3 g/dL (ref 30.0–36.0)
MCV: 88.6 fL (ref 80.0–100.0)
Monocytes Absolute: 0.6 10*3/uL (ref 0.1–1.0)
Monocytes Relative: 10 %
Neutro Abs: 2.6 10*3/uL (ref 1.7–7.7)
Neutrophils Relative %: 43 %
Platelets: 180 10*3/uL (ref 150–400)
RBC: 4.47 MIL/uL (ref 3.87–5.11)
RDW: 12.8 % (ref 11.5–15.5)
WBC: 6 10*3/uL (ref 4.0–10.5)
nRBC: 0 % (ref 0.0–0.2)

## 2020-10-20 LAB — COMPREHENSIVE METABOLIC PANEL
ALT: 24 U/L (ref 0–44)
AST: 24 U/L (ref 15–41)
Albumin: 3.7 g/dL (ref 3.5–5.0)
Alkaline Phosphatase: 92 U/L (ref 38–126)
Anion gap: 7 (ref 5–15)
BUN: 9 mg/dL (ref 8–23)
CO2: 24 mmol/L (ref 22–32)
Calcium: 8.8 mg/dL — ABNORMAL LOW (ref 8.9–10.3)
Chloride: 108 mmol/L (ref 98–111)
Creatinine, Ser: 0.78 mg/dL (ref 0.44–1.00)
GFR, Estimated: >60 mL/min (ref >60)
Glucose, Bld: 95 mg/dL (ref 70–99)
Potassium: 4.2 mmol/L (ref 3.5–5.1)
Sodium: 139 mmol/L (ref 135–145)
Total Bilirubin: 0.2 mg/dL — ABNORMAL LOW (ref 0.3–1.2)
Total Protein: 6.7 g/dL (ref 6.5–8.1)

## 2020-10-20 MED ORDER — TAMOXIFEN CITRATE 20 MG PO TABS: 20.0000 mg | ORAL_TABLET | Freq: Every day | ORAL | 4 refills | Status: DC

## 2020-10-20 MED ORDER — GABAPENTIN 300 MG PO CAPS: 600.0000 mg | ORAL_CAPSULE | Freq: Every day | ORAL | 6 refills | Status: DC

## 2020-10-20 NOTE — Telephone Encounter (Signed)
Scheduled appointment per 06/30 los. Patient is aware.

## 2020-10-21 ENCOUNTER — Encounter: Payer: Medicare Other | Admitting: Registered"

## 2020-10-25 DIAGNOSIS — Z0000 Encounter for general adult medical examination without abnormal findings: Secondary | ICD-10-CM | POA: Diagnosis not present

## 2020-10-25 DIAGNOSIS — G479 Sleep disorder, unspecified: Secondary | ICD-10-CM | POA: Diagnosis not present

## 2020-10-25 DIAGNOSIS — Z853 Personal history of malignant neoplasm of breast: Secondary | ICD-10-CM | POA: Diagnosis not present

## 2020-10-25 DIAGNOSIS — C50911 Malignant neoplasm of unspecified site of right female breast: Secondary | ICD-10-CM | POA: Diagnosis not present

## 2020-10-25 DIAGNOSIS — R7309 Other abnormal glucose: Secondary | ICD-10-CM | POA: Diagnosis not present

## 2020-10-25 DIAGNOSIS — M1990 Unspecified osteoarthritis, unspecified site: Secondary | ICD-10-CM | POA: Diagnosis not present

## 2020-10-25 DIAGNOSIS — M85852 Other specified disorders of bone density and structure, left thigh: Secondary | ICD-10-CM | POA: Diagnosis not present

## 2020-11-04 ENCOUNTER — Encounter: Payer: Medicare Other | Admitting: Registered"

## 2020-11-04 DIAGNOSIS — M85852 Other specified disorders of bone density and structure, left thigh: Secondary | ICD-10-CM | POA: Diagnosis not present

## 2020-11-04 DIAGNOSIS — Z23 Encounter for immunization: Secondary | ICD-10-CM | POA: Diagnosis not present

## 2020-11-04 DIAGNOSIS — M19049 Primary osteoarthritis, unspecified hand: Secondary | ICD-10-CM | POA: Diagnosis not present

## 2020-11-04 DIAGNOSIS — G479 Sleep disorder, unspecified: Secondary | ICD-10-CM | POA: Diagnosis not present

## 2020-11-04 DIAGNOSIS — E785 Hyperlipidemia, unspecified: Secondary | ICD-10-CM | POA: Diagnosis not present

## 2020-11-04 DIAGNOSIS — Z0000 Encounter for general adult medical examination without abnormal findings: Secondary | ICD-10-CM | POA: Diagnosis not present

## 2020-11-04 DIAGNOSIS — Z170 Estrogen receptor positive status [ER+]: Secondary | ICD-10-CM | POA: Diagnosis not present

## 2020-11-04 DIAGNOSIS — M810 Age-related osteoporosis without current pathological fracture: Secondary | ICD-10-CM | POA: Diagnosis not present

## 2020-11-04 DIAGNOSIS — C50911 Malignant neoplasm of unspecified site of right female breast: Secondary | ICD-10-CM | POA: Diagnosis not present

## 2020-11-04 DIAGNOSIS — M1990 Unspecified osteoarthritis, unspecified site: Secondary | ICD-10-CM | POA: Diagnosis not present

## 2020-11-15 DIAGNOSIS — M85852 Other specified disorders of bone density and structure, left thigh: Secondary | ICD-10-CM | POA: Diagnosis not present

## 2020-11-15 DIAGNOSIS — M85851 Other specified disorders of bone density and structure, right thigh: Secondary | ICD-10-CM | POA: Diagnosis not present

## 2021-02-23 DIAGNOSIS — H5213 Myopia, bilateral: Secondary | ICD-10-CM | POA: Diagnosis not present

## 2021-02-23 DIAGNOSIS — H2513 Age-related nuclear cataract, bilateral: Secondary | ICD-10-CM | POA: Diagnosis not present

## 2021-02-23 DIAGNOSIS — H35373 Puckering of macula, bilateral: Secondary | ICD-10-CM | POA: Diagnosis not present

## 2021-03-23 DIAGNOSIS — L814 Other melanin hyperpigmentation: Secondary | ICD-10-CM | POA: Diagnosis not present

## 2021-03-23 DIAGNOSIS — R202 Paresthesia of skin: Secondary | ICD-10-CM | POA: Diagnosis not present

## 2021-03-23 DIAGNOSIS — C4401 Basal cell carcinoma of skin of lip: Secondary | ICD-10-CM | POA: Diagnosis not present

## 2021-03-23 DIAGNOSIS — L723 Sebaceous cyst: Secondary | ICD-10-CM | POA: Diagnosis not present

## 2021-03-23 DIAGNOSIS — L570 Actinic keratosis: Secondary | ICD-10-CM | POA: Diagnosis not present

## 2021-03-23 DIAGNOSIS — D1800 Hemangioma unspecified site: Secondary | ICD-10-CM | POA: Diagnosis not present

## 2021-03-23 DIAGNOSIS — D229 Melanocytic nevi, unspecified: Secondary | ICD-10-CM | POA: Diagnosis not present

## 2021-03-23 DIAGNOSIS — Z23 Encounter for immunization: Secondary | ICD-10-CM | POA: Diagnosis not present

## 2021-03-23 DIAGNOSIS — Z85828 Personal history of other malignant neoplasm of skin: Secondary | ICD-10-CM | POA: Diagnosis not present

## 2021-03-23 DIAGNOSIS — L089 Local infection of the skin and subcutaneous tissue, unspecified: Secondary | ICD-10-CM | POA: Diagnosis not present

## 2021-03-23 DIAGNOSIS — L821 Other seborrheic keratosis: Secondary | ICD-10-CM | POA: Diagnosis not present

## 2021-03-23 DIAGNOSIS — D485 Neoplasm of uncertain behavior of skin: Secondary | ICD-10-CM | POA: Diagnosis not present

## 2021-05-03 DIAGNOSIS — R7309 Other abnormal glucose: Secondary | ICD-10-CM | POA: Diagnosis not present

## 2021-05-03 DIAGNOSIS — M85852 Other specified disorders of bone density and structure, left thigh: Secondary | ICD-10-CM | POA: Diagnosis not present

## 2021-05-03 DIAGNOSIS — E785 Hyperlipidemia, unspecified: Secondary | ICD-10-CM | POA: Diagnosis not present

## 2021-05-11 DIAGNOSIS — G479 Sleep disorder, unspecified: Secondary | ICD-10-CM | POA: Diagnosis not present

## 2021-05-11 DIAGNOSIS — M1990 Unspecified osteoarthritis, unspecified site: Secondary | ICD-10-CM | POA: Diagnosis not present

## 2021-05-11 DIAGNOSIS — R519 Headache, unspecified: Secondary | ICD-10-CM | POA: Diagnosis not present

## 2021-05-11 DIAGNOSIS — E785 Hyperlipidemia, unspecified: Secondary | ICD-10-CM | POA: Diagnosis not present

## 2021-05-11 DIAGNOSIS — M810 Age-related osteoporosis without current pathological fracture: Secondary | ICD-10-CM | POA: Diagnosis not present

## 2021-05-11 DIAGNOSIS — C50911 Malignant neoplasm of unspecified site of right female breast: Secondary | ICD-10-CM | POA: Diagnosis not present

## 2021-05-11 DIAGNOSIS — R7301 Impaired fasting glucose: Secondary | ICD-10-CM | POA: Diagnosis not present

## 2021-06-29 ENCOUNTER — Telehealth: Payer: Self-pay | Admitting: Hematology and Oncology

## 2021-06-29 DIAGNOSIS — C4401 Basal cell carcinoma of skin of lip: Secondary | ICD-10-CM | POA: Diagnosis not present

## 2021-06-29 NOTE — Telephone Encounter (Signed)
Rescheduled appointment per providers template. Left message.  ? ?

## 2021-08-15 ENCOUNTER — Telehealth: Payer: Self-pay | Admitting: Hematology and Oncology

## 2021-08-15 NOTE — Telephone Encounter (Signed)
Rescheduled appointment per providers. Patient aware.  ? ?

## 2021-10-11 ENCOUNTER — Ambulatory Visit: Payer: Medicare Other | Admitting: Hematology and Oncology

## 2021-10-11 ENCOUNTER — Other Ambulatory Visit: Payer: Medicare Other

## 2021-10-12 ENCOUNTER — Ambulatory Visit: Payer: Medicare Other | Admitting: Hematology and Oncology

## 2021-10-12 ENCOUNTER — Other Ambulatory Visit: Payer: Medicare Other

## 2021-10-12 ENCOUNTER — Other Ambulatory Visit: Payer: Self-pay | Admitting: *Deleted

## 2021-10-20 ENCOUNTER — Ambulatory Visit: Payer: Medicare Other | Admitting: Hematology and Oncology

## 2021-10-20 ENCOUNTER — Other Ambulatory Visit: Payer: Medicare Other

## 2021-10-26 ENCOUNTER — Telehealth: Payer: Self-pay | Admitting: Hematology and Oncology

## 2021-10-26 ENCOUNTER — Other Ambulatory Visit: Payer: Self-pay | Admitting: *Deleted

## 2021-10-26 DIAGNOSIS — C50311 Malignant neoplasm of lower-inner quadrant of right female breast: Secondary | ICD-10-CM

## 2021-10-26 MED ORDER — GABAPENTIN 300 MG PO CAPS: 600.0000 mg | ORAL_CAPSULE | Freq: Every day | ORAL | 6 refills | Status: DC

## 2021-10-26 NOTE — Telephone Encounter (Signed)
Per 7/6 phone line pt called and wanted to reschedule.  Rescheduled appointment per pt request.  Pt picked the day and time

## 2021-10-27 ENCOUNTER — Inpatient Hospital Stay: Payer: Medicare Other | Admitting: Hematology and Oncology

## 2021-10-27 ENCOUNTER — Inpatient Hospital Stay: Payer: Medicare Other

## 2021-11-01 DIAGNOSIS — Z1231 Encounter for screening mammogram for malignant neoplasm of breast: Secondary | ICD-10-CM | POA: Diagnosis not present

## 2021-11-06 DIAGNOSIS — Z0000 Encounter for general adult medical examination without abnormal findings: Secondary | ICD-10-CM | POA: Diagnosis not present

## 2021-11-06 DIAGNOSIS — R7301 Impaired fasting glucose: Secondary | ICD-10-CM | POA: Diagnosis not present

## 2021-11-21 DIAGNOSIS — G479 Sleep disorder, unspecified: Secondary | ICD-10-CM | POA: Diagnosis not present

## 2021-11-21 DIAGNOSIS — M542 Cervicalgia: Secondary | ICD-10-CM | POA: Diagnosis not present

## 2021-11-21 DIAGNOSIS — C50911 Malignant neoplasm of unspecified site of right female breast: Secondary | ICD-10-CM | POA: Diagnosis not present

## 2021-11-21 DIAGNOSIS — Z6823 Body mass index (BMI) 23.0-23.9, adult: Secondary | ICD-10-CM | POA: Diagnosis not present

## 2021-11-21 DIAGNOSIS — M79645 Pain in left finger(s): Secondary | ICD-10-CM | POA: Diagnosis not present

## 2021-11-21 DIAGNOSIS — M85852 Other specified disorders of bone density and structure, left thigh: Secondary | ICD-10-CM | POA: Diagnosis not present

## 2021-11-22 ENCOUNTER — Inpatient Hospital Stay: Payer: Medicare Other | Admitting: Hematology and Oncology

## 2021-11-22 ENCOUNTER — Other Ambulatory Visit: Payer: Self-pay

## 2021-11-22 ENCOUNTER — Inpatient Hospital Stay: Payer: Medicare Other | Attending: Hematology and Oncology

## 2021-11-22 ENCOUNTER — Encounter: Payer: Self-pay | Admitting: Hematology and Oncology

## 2021-11-22 VITALS — BP 102/67 | HR 87 | Temp 98.1°F | Resp 16 | Ht 63.0 in | Wt 133.6 lb

## 2021-11-22 DIAGNOSIS — C50311 Malignant neoplasm of lower-inner quadrant of right female breast: Secondary | ICD-10-CM

## 2021-11-22 DIAGNOSIS — Z79899 Other long term (current) drug therapy: Secondary | ICD-10-CM | POA: Insufficient documentation

## 2021-11-22 DIAGNOSIS — Z170 Estrogen receptor positive status [ER+]: Secondary | ICD-10-CM | POA: Insufficient documentation

## 2021-11-22 DIAGNOSIS — M1909 Primary osteoarthritis, other specified site: Secondary | ICD-10-CM | POA: Insufficient documentation

## 2021-11-22 DIAGNOSIS — Z803 Family history of malignant neoplasm of breast: Secondary | ICD-10-CM | POA: Diagnosis not present

## 2021-11-22 DIAGNOSIS — Z79810 Long term (current) use of selective estrogen receptor modulators (SERMs): Secondary | ICD-10-CM | POA: Diagnosis not present

## 2021-11-22 DIAGNOSIS — Z87442 Personal history of urinary calculi: Secondary | ICD-10-CM | POA: Diagnosis not present

## 2021-11-22 LAB — CMP (CANCER CENTER ONLY)
ALT: 18 U/L (ref 0–44)
AST: 22 U/L (ref 15–41)
Albumin: 4 g/dL (ref 3.5–5.0)
Alkaline Phosphatase: 72 U/L (ref 38–126)
Anion gap: 7 (ref 5–15)
BUN: 10 mg/dL (ref 8–23)
CO2: 25 mmol/L (ref 22–32)
Calcium: 8.9 mg/dL (ref 8.9–10.3)
Chloride: 107 mmol/L (ref 98–111)
Creatinine: 0.78 mg/dL (ref 0.44–1.00)
GFR, Estimated: >60 mL/min (ref >60)
Glucose, Bld: 99 mg/dL (ref 70–99)
Potassium: 3.9 mmol/L (ref 3.5–5.1)
Sodium: 139 mmol/L (ref 135–145)
Total Bilirubin: 0.4 mg/dL (ref 0.3–1.2)
Total Protein: 6.6 g/dL (ref 6.5–8.1)

## 2021-11-22 LAB — CBC WITH DIFFERENTIAL (CANCER CENTER ONLY)
Abs Immature Granulocytes: 0.01 10*3/uL (ref 0.00–0.07)
Basophils Absolute: 0.1 10*3/uL (ref 0.0–0.1)
Basophils Relative: 1 %
Eosinophils Absolute: 0.1 10*3/uL (ref 0.0–0.5)
Eosinophils Relative: 2 %
HCT: 38.8 % (ref 36.0–46.0)
Hemoglobin: 13 g/dL (ref 12.0–15.0)
Immature Granulocytes: 0 %
Lymphocytes Relative: 51 %
Lymphs Abs: 3.9 10*3/uL (ref 0.7–4.0)
MCH: 30 pg (ref 26.0–34.0)
MCHC: 33.5 g/dL (ref 30.0–36.0)
MCV: 89.6 fL (ref 80.0–100.0)
Monocytes Absolute: 0.6 10*3/uL (ref 0.1–1.0)
Monocytes Relative: 8 %
Neutro Abs: 2.8 10*3/uL (ref 1.7–7.7)
Neutrophils Relative %: 38 %
Platelet Count: 171 10*3/uL (ref 150–400)
RBC: 4.33 MIL/uL (ref 3.87–5.11)
RDW: 12.8 % (ref 11.5–15.5)
WBC Count: 7.6 10*3/uL (ref 4.0–10.5)
nRBC: 0 % (ref 0.0–0.2)

## 2021-11-22 NOTE — Progress Notes (Signed)
[place]  Telephone:([phone] Fax:(336) [phone]     ID: Korri Ask DOB: 17-Nov-1940  MR#: [id number]  ZHY#:[id number]  Patient Care Team: Cari Caraway, MD as PCP - General (Family Medicine) Magrinat, Virgie Dad, MD (Inactive) as Consulting Physician (Oncology) Rolm Bookbinder, MD as Consulting Physician (General Surgery) Kyung Rudd, MD as Consulting Physician (Radiation Oncology) Christene Slates, MD as Physician Assistant (Radiology) Delice Bison, Charlestine Massed, NP as Nurse Practitioner (Hematology and Oncology) OTHER MD:   CHIEF COMPLAINT: Estrogen receptor positive breast cancer  CURRENT TREATMENT: Tamoxifen  INTERVAL HISTORY:  Hoda returns today for follow-up of her estrogen receptor positive breast cancer.  She continues on tamoxifen and gabapentin.  She has 2 to 3 pills left in the current bottle. Most recent mammogram from July 2023 with no mammographic evidence of malignancy. She denies any breast changes.  REVIEW OF SYSTEMS: A detailed review of systems today was otherwise entirely benign.   COVID 19 VACCINATION STATUS: [place] x2, status post booster x2, also had COVID February 2022   BREAST CANCER HISTORY: From the original intake note:  Aidee had routine bilateral screening mammography at [facility] 07/20/2016. The breast density was category B. In the right breast lower inner quadrant there was an oval mass associated with architectural distortion. On 08/02/2016 the patient was brought back for further imaging. Right diagnostic mammography confirmed a 0.5 cm all nodule in the right breast at the 4:00 radiant. By ultrasonography this measured 0.4 cm and had lobulated margins.  Biopsy of the right breast mass in question 08/13/2016 showed ([id number]) invasive ductal carcinoma, grade 1, estrogen receptor 100% positive, progesterone receptor 100% positive, both with strong staining intensity, with an MIB-1 of 2%, and no HER-2 notification, the  signals ratio being 1.27 and the number per cell 2.10.  The patient's subsequent history is as detailed below   PAST MEDICAL HISTORY: Past Medical History:  Diagnosis Date   Arthritis    neck   Cancer ([place]) 07/2016   right breast cancer   Depression    Family history of adverse reaction to anesthesia    mother had cardiac arrest and PONV   Headache    migraine   History of kidney stones    No pertinent past medical history     PAST SURGICAL HISTORY: Past Surgical History:  Procedure Laterality Date   BACK SURGERY     diskectomy L5-6   BREAST LUMPECTOMY WITH RADIOACTIVE SEED AND SENTINEL LYMPH NODE BIOPSY Right 09/03/2016   Procedure: RIGHT BREAST LUMPECTOMY WITH RADIOACTIVE SEED AND RIGHT AXILLARY SENTINEL NODE BIOPSY;  Surgeon: Rolm Bookbinder, MD;  Location: [place];  Service: General;  Laterality: Right;   CESAREAN SECTION     triplets   DILATION AND CURETTAGE OF UTERUS     KNEE ARTHROSCOPY Right    RE-EXCISION OF BREAST LUMPECTOMY Right 09/11/2016   Procedure: RIGHT RE-EXCISION OF BREAST LUMPECTOMY;  Surgeon: Rolm Bookbinder, MD;  Location: [place];  Service: General;  Laterality: Right;   TOTAL KNEE ARTHROPLASTY Right 03/16/2019   Procedure: TOTAL KNEE ARTHROPLASTY;  Surgeon: Gaynelle Arabian, MD;  Location[facility];  Service: Orthopedics;  Laterality: Right;  29min    FAMILY HISTORY Family History  Problem Relation Age of Onset   Breast cancer Paternal Aunt    Breast cancer Cousin   The patient's father died from complications of emphysema at age 78. The patient's mother died following a stroke at age 65. The patient had no brothers, 1 sister. A paternal aunt  had breast cancer in her 66s and a paternal second cousin breast cancer in her 64s. There is no history of ovarian cancer in the family   GYNECOLOGIC HISTORY:  No LMP recorded. Patient is postmenopausal. Menarche age 71, first live birth age 17. The patient is GX P3. She stopped having  periods in 2004. She took hormone replacement until 2018, when she was diagnosed with breast cancer.   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 Her husband [name] is a Games developer. Her children, from a prior marriage, are [name] who lives in [place] and is an Chief Financial Officer, [name], who is completing her Spanish literature PhD program at [employer] May 2019 and will be working in a 10-year track position at [employer] (and whose husband is from [place] and Sheldon, who works in [place].    ADVANCED DIRECTIVES: The patient tells me she has named her daughter [name] as her healthcare power of attorney. [name] can be contacted at [phone].   HEALTH MAINTENANCE: Social History   Tobacco Use   Smoking status: Never   Smokeless tobacco: Never  Vaping Use   Vaping Use: Never used  Substance Use Topics   Alcohol use: Yes    Comment: occ   Drug use: No     Colonoscopy: 2016/[facility]     PAP:  Bone density: 2013   Allergies  Allergen Reactions   Darvon [Propoxyphene Hcl] Other (See Comments)    hyperactivity   Penicillins Itching    Has patient had a PCN reaction causing immediate rash, facial/tongue/throat swelling, SOB or lightheadedness with hypotension: No Has patient had a PCN reaction causing severe rash involving mucus membranes or skin necrosis: No Has patient had a PCN reaction that required hospitalization: No Has patient had a PCN reaction occurring within the last 10 years: No If all of the above answers are "NO", then may proceed with Cephalosporin use.     Current Outpatient Medications  Medication Sig Dispense Refill   clonazePAM (KLONOPIN) 0.5 MG tablet TAKE 1-2 TABLETS BY MOUTH AT BEDTIME AS NEEDED FOR SLEEP (Patient taking differently: Take 0.5-1 mg by mouth at bedtime. ) 60 tablet 0   gabapentin (NEURONTIN) 300 MG capsule Take 2 capsules (600 mg total) by mouth at  bedtime. 120 capsule 6   sertraline (ZOLOFT) 100 MG tablet Take 150 mg by mouth daily.     tamoxifen (NOLVADEX) 20 MG tablet Take 1 tablet (20 mg total) by mouth daily. 90 tablet 4   No current facility-administered medications for this visit.    OBJECTIVE: white woman who appears well Vitals:   11/22/21 1523  BP: 102/67  Pulse: 87  Resp: 16  Temp: 98.1 F (36.7 C)  SpO2: 95%       Body mass index is 23.67 kg/m.    ECOG FS:0 - Asymptomatic  Sclerae unicteric, EOMs intact Wearing a mask No cervical or supraclavicular adenopathy Lungs no rales or rhonchi Heart regular rate and rhythm Abd soft, nontender, positive bowel sounds MSK no focal spinal tenderness, no upper extremity lymphedema Neuro: nonfocal, well oriented, appropriate affect Breasts: Small palpable abnormality in the left breast at around 12 o'clock position, likely cystic in nature.  Rest of the breast normal to inspection and palpation.  No regional adenopathy.   LAB RESULTS:  CMP     Component Value Date/Time   NA 139 11/22/2021 1457   NA 143 08/22/2016 0831  K 3.9 11/22/2021 1457   K 4.3 08/22/2016 0831   CL 107 11/22/2021 1457   CO2 25 11/22/2021 1457   CO2 26 08/22/2016 0831   GLUCOSE 99 11/22/2021 1457   GLUCOSE 87 08/22/2016 0831   BUN 10 11/22/2021 1457   BUN 8.7 08/22/2016 0831   CREATININE 0.78 11/22/2021 1457   CREATININE 0.9 08/22/2016 0831   CALCIUM 8.9 11/22/2021 1457   CALCIUM 9.3 08/22/2016 0831   PROT 6.6 11/22/2021 1457   PROT 7.1 08/22/2016 0831   ALBUMIN 4.0 11/22/2021 1457   ALBUMIN 4.3 08/22/2016 0831   AST 22 11/22/2021 1457   AST 29 08/22/2016 0831   ALT 18 11/22/2021 1457   ALT 29 08/22/2016 0831   ALKPHOS 72 11/22/2021 1457   ALKPHOS 85 08/22/2016 0831   BILITOT 0.4 11/22/2021 1457   BILITOT 0.35 08/22/2016 0831   GFRNONAA >60 11/22/2021 1457   GFRAA >60 10/12/2019 0931   GFRAA >60 03/05/2018 1455    No results found for: "TOTALPROTELP", "ALBUMINELP", "A1GS",  "A2GS", "BETS", "BETA2SER", "GAMS", "MSPIKE", "SPEI"  No results found for: "KPAFRELGTCHN", "LAMBDASER", "KAPLAMBRATIO"  Lab Results  Component Value Date   WBC 7.6 11/22/2021   NEUTROABS 2.8 11/22/2021   HGB 13.0 11/22/2021   HCT 38.8 11/22/2021   MCV 89.6 11/22/2021   PLT 171 11/22/2021      Chemistry      Component Value Date/Time   NA 139 11/22/2021 1457   NA 143 08/22/2016 0831   K 3.9 11/22/2021 1457   K 4.3 08/22/2016 0831   CL 107 11/22/2021 1457   CO2 25 11/22/2021 1457   CO2 26 08/22/2016 0831   BUN 10 11/22/2021 1457   BUN 8.7 08/22/2016 0831   CREATININE 0.78 11/22/2021 1457   CREATININE 0.9 08/22/2016 0831      Component Value Date/Time   CALCIUM 8.9 11/22/2021 1457   CALCIUM 9.3 08/22/2016 0831   ALKPHOS 72 11/22/2021 1457   ALKPHOS 85 08/22/2016 0831   AST 22 11/22/2021 1457   AST 29 08/22/2016 0831   ALT 18 11/22/2021 1457   ALT 29 08/22/2016 0831   BILITOT 0.4 11/22/2021 1457   BILITOT 0.35 08/22/2016 0831       No results found for: "LABCA2"  No components found for: "[id number]"  No results for input(s): "INR" in the last 168 hours.  Urinalysis No results found for: "COLORURINE", "APPEARANCEUR", "LABSPEC", "PHURINE", "GLUCOSEU", "HGBUR", "BILIRUBINUR", "KETONESUR", "PROTEINUR", "UROBILINOGEN", "NITRITE", "LEUKOCYTESUR"   STUDIES: No results found.   ELIGIBLE FOR AVAILABLE RESEARCH PROTOCOL: No  ASSESSMENT: 71 y.o[place] woman status post right breast lower inner quadrant biopsy 08/13/2016 for a clinical T1a N0, stage IA invasive ductal carcinoma, grade 1, estrogen and progesterone receptor positive, HER-2 not amplified, with an MIB-1 of 2%  (1) status post right lumpectomy and sentinel lymph node sampling 09/03/2016 for a pT1b pN0, stage IA invasive ductal carcinoma, grade 1, with a broadly positive anterior margin   (a) margins cleared with additional surgery 09/11/2016  (2) adjuvant radiation6/20/2018 to 11/07/2016   Site/dose:    1. The Right breast was treated to 42.5 Gy in 17 fractions at 2.5 Gy per fraction. 2. The Right breast was boosted to 7.5 Gy in 3 fractions at 2.5 Gy per fraction.   (3) Tamoxifen started 12/25/2016   PLAN:  Ms. Michaelson is here for follow-up on adjuvant tamoxifen.  She will complete 5 years of antiestrogen therapy in September 2023. She just has 2 to 3 pills left  in the current prescription.  Okay to discontinue tamoxifen after this prescription. On exam, felt a small nodularity in the left breast at 12 o'clock position and 7 ordered diagnostic mammogram and ultrasound.  This is to be done at [facility].  We will help her schedule this. We will do a telephone visit in about 4 weeks.  If this is indeed benign, then she can continue to follow-up with her PCP and return to oncology as needed.  She will continue annual screening mammograms. She can also try to taper off of gabapentin if her hot flashes continue to be controlled once she is done with tamoxifen.  Total time spent: 30 minutes  *Total Encounter Time as defined by the Centers for Medicare and Medicaid Services includes, in addition to the face-to-face time of a patient visit (documented in the note above) non-face-to-face time: obtaining and reviewing outside history, ordering and reviewing medications, tests or procedures, care coordination (communications with other health care professionals or caregivers) and documentation in the medical record.

## 2021-11-23 ENCOUNTER — Telehealth: Payer: Self-pay | Admitting: Hematology and Oncology

## 2021-11-23 NOTE — Telephone Encounter (Signed)
Contacted patient to scheduled appointments. Left message with appointment details and a call back number if patient had any questions or could not accommodate the time we provided.   

## 2021-11-29 DIAGNOSIS — N6320 Unspecified lump in the left breast, unspecified quadrant: Secondary | ICD-10-CM | POA: Diagnosis not present

## 2021-12-06 DIAGNOSIS — M542 Cervicalgia: Secondary | ICD-10-CM | POA: Diagnosis not present

## 2021-12-13 DIAGNOSIS — M542 Cervicalgia: Secondary | ICD-10-CM | POA: Diagnosis not present

## 2021-12-20 DIAGNOSIS — M542 Cervicalgia: Secondary | ICD-10-CM | POA: Diagnosis not present

## 2021-12-21 ENCOUNTER — Inpatient Hospital Stay (HOSPITAL_BASED_OUTPATIENT_CLINIC_OR_DEPARTMENT_OTHER): Payer: Medicare Other | Admitting: Hematology and Oncology

## 2021-12-21 DIAGNOSIS — C50311 Malignant neoplasm of lower-inner quadrant of right female breast: Secondary | ICD-10-CM

## 2021-12-21 DIAGNOSIS — Z170 Estrogen receptor positive status [ER+]: Secondary | ICD-10-CM

## 2021-12-21 NOTE — Progress Notes (Signed)
[name]  Telephone:([phone]) [phone] Fax:(336) [phone]     ID: [name] DOB: Apr 19, 1951  MR#: [id number]  TZG#:[id number]  Patient Care Team: Victoria Caraway, MD as PCP - General (Family Medicine) Magrinat, Virgie Dad, MD (Inactive) as Consulting Physician (Oncology) Victoria Bookbinder, MD as Consulting Physician (General Surgery) Victoria Rudd, MD as Consulting Physician (Radiation Oncology) Victoria Slates, MD as Physician Assistant (Radiology) [name], Victoria Massed, NP as Nurse Practitioner (Hematology and Oncology)   CHIEF COMPLAINT: Estrogen receptor positive breast cancer  CURRENT TREATMENT: Tamoxifen  INTERVAL HISTORY:  Patient didn't answer her phone.  REVIEW OF SYSTEMS: A detailed review of systems today was otherwise entirely benign.   COVID 19 VACCINATION STATUS: [place] x2, status post booster x2, also had COVID February 2022   BREAST CANCER HISTORY: From the original intake note:  [name] had routine bilateral screening mammography at [name] 07/20/2016. The breast density was category B. In the right breast lower inner quadrant there was an oval mass associated with architectural distortion. On 08/02/2016 the patient was brought back for further imaging. Right diagnostic mammography confirmed a 0.5 cm all nodule in the right breast at the 4:00 radiant. By ultrasonography this measured 0.4 cm and had lobulated margins.  Biopsy of the right breast mass in question 08/13/2016 showed ([id number]) invasive ductal carcinoma, grade 1, estrogen receptor 100% positive, progesterone receptor 100% positive, both with strong staining intensity, with an MIB-1 of 2%, and no HER-2 notification, the signals ratio being 1.27 and the number per cell 2.10.  The patient's subsequent history is as detailed below   PAST MEDICAL HISTORY: Past Medical History:  Diagnosis Date   Arthritis    neck   Cancer ([place]) 07/2016   right breast cancer   Depression    Family  history of adverse reaction to anesthesia    mother had cardiac arrest and PONV   Headache    migraine   History of kidney stones    No pertinent past medical history     PAST SURGICAL HISTORY: Past Surgical History:  Procedure Laterality Date   BACK SURGERY     diskectomy L5-6   BREAST LUMPECTOMY WITH RADIOACTIVE SEED AND SENTINEL LYMPH NODE BIOPSY Right 09/03/2016   Procedure: RIGHT BREAST LUMPECTOMY WITH RADIOACTIVE SEED AND RIGHT AXILLARY SENTINEL NODE BIOPSY;  Surgeon: Victoria Bookbinder, MD;  Location: [name];  Service: General;  Laterality: Right;   CESAREAN SECTION     triplets   DILATION AND CURETTAGE OF UTERUS     KNEE ARTHROSCOPY Right    RE-EXCISION OF BREAST LUMPECTOMY Right 09/11/2016   Procedure: RIGHT RE-EXCISION OF BREAST LUMPECTOMY;  Surgeon: Victoria Bookbinder, MD;  Location: [facility];  Service: General;  Laterality: Right;   TOTAL KNEE ARTHROPLASTY Right 03/16/2019   Procedure: TOTAL KNEE ARTHROPLASTY;  Surgeon: Victoria Arabian, MD;  Location: [name];  Service: Orthopedics;  Laterality: Right;  35mn    FAMILY HISTORY Family History  Problem Relation Age of Onset   Breast cancer Paternal Aunt    Breast cancer Cousin   The patient's father died from complications of emphysema at age 70 The patient's mother died following a stroke at age 70 The patient had no brothers, 1 sister. A paternal aunt had breast cancer in her 899sand a paternal second cousin breast cancer in her 66s There is no history of ovarian cancer in the family   GYNECOLOGIC HISTORY:  No LMP recorded. Patient is postmenopausal. Menarche age [id number] first live birth age 71  The patient is GX P3. She stopped having periods in 2004. She took hormone replacement until 2018, when she was diagnosed with breast cancer.   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 Her husband [name] is a Games developer. Her children, from a prior marriage, are  [facility]ia [name] who lives in [place] and is an Chief Financial Officer, [name], who is completing her Spanish literature PhD program at [name] May 2019 and will be working in a 10-year track position at [employer] (and whose husband is from [place]), and [name], who works in [place].    ADVANCED DIRECTIVES: The patient tells me she has named her daughter [name] as her healthcare power of attorney. [name] can be contacted at [phone].   HEALTH MAINTENANCE: Social History   Tobacco Use   Smoking status: Never   Smokeless tobacco: Never  Vaping Use   Vaping Use: Never used  Substance Use Topics   Alcohol use: Yes    Comment: occ   Drug use: No     Colonoscopy: 2016/[name]     PAP:  Bone density: 2013   Allergies  Allergen Reactions   Darvon [Propoxyphene Hcl] Other (See Comments)    hyperactivity   Penicillins Itching    Has patient had a PCN reaction causing immediate rash, facial/tongue/throat swelling, SOB or lightheadedness with hypotension: No Has patient had a PCN reaction causing severe rash involving mucus membranes or skin necrosis: No Has patient had a PCN reaction that required hospitalization: No Has patient had a PCN reaction occurring within the last 10 years: No If all of the above answers are "NO", then may proceed with Cephalosporin use.     Current Outpatient Medications  Medication Sig Dispense Refill   clonazePAM (KLONOPIN) 0.5 MG tablet TAKE 1-2 TABLETS BY MOUTH AT BEDTIME AS NEEDED FOR SLEEP (Patient taking differently: Take 0.5-1 mg by mouth at bedtime. ) 60 tablet 0   gabapentin (NEURONTIN) 300 MG capsule Take 2 capsules (600 mg total) by mouth at bedtime. 120 capsule 6   sertraline (ZOLOFT) 100 MG tablet Take 150 mg by mouth daily.     tamoxifen (NOLVADEX) 20 MG tablet Take 1 tablet (20 mg total) by mouth daily. 90 tablet 4   No current facility-administered medications for this visit.    OBJECTIVE: white woman  who appears well There were no vitals filed for this visit.      There is no height or weight on [name] to calculate BMI.    ECOG FS:0 - Asymptomatic  Telephone visit   LAB RESULTS:  CMP     Component Value Date/Time   NA 139 11/22/2021 1457   NA 143 08/22/2016 0831   K 3.9 11/22/2021 1457   K 4.3 08/22/2016 0831   CL 107 11/22/2021 1457   CO2 25 11/22/2021 1457   CO2 26 08/22/2016 0831   GLUCOSE 99 11/22/2021 1457   GLUCOSE 87 08/22/2016 0831   BUN 10 11/22/2021 1457   BUN 8.7 08/22/2016 0831   CREATININE 0.78 11/22/2021 1457   CREATININE 0.9 08/22/2016 0831   CALCIUM 8.9 11/22/2021 1457   CALCIUM 9.3 08/22/2016 0831   PROT 6.6 11/22/2021 1457   PROT 7.1 08/22/2016 0831   ALBUMIN 4.0 11/22/2021 1457   ALBUMIN 4.3 08/22/2016 0831   AST 22 11/22/2021 1457   AST 29 08/22/2016 0831   ALT 18 11/22/2021 1457   ALT 29 08/22/2016 0831   ALKPHOS  72 11/22/2021 1457   ALKPHOS 85 08/22/2016 0831   BILITOT 0.4 11/22/2021 1457   BILITOT 0.35 08/22/2016 0831   GFRNONAA >60 11/22/2021 1457   GFRAA >60 10/12/2019 0931   GFRAA >60 03/05/2018 1455    No results found for: "TOTALPROTELP", "ALBUMINELP", "A1GS", "A2GS", "BETS", "BETA2SER", "GAMS", "MSPIKE", "SPEI"  No results found for: "KPAFRELGTCHN", "LAMBDASER", "KAPLAMBRATIO"  Lab Results  Component Value Date   WBC 7.6 11/22/2021   NEUTROABS 2.8 11/22/2021   HGB 13.0 11/22/2021   HCT 38.8 11/22/2021   MCV 89.6 11/22/2021   PLT 171 11/22/2021      Chemistry      Component Value Date/Time   NA 139 11/22/2021 1457   NA 143 08/22/2016 0831   K 3.9 11/22/2021 1457   K 4.3 08/22/2016 0831   CL 107 11/22/2021 1457   CO2 25 11/22/2021 1457   CO2 26 08/22/2016 0831   BUN 10 11/22/2021 1457   BUN 8.7 08/22/2016 0831   CREATININE 0.78 11/22/2021 1457   CREATININE 0.9 08/22/2016 0831      Component Value Date/Time   CALCIUM 8.9 11/22/2021 1457   CALCIUM 9.3 08/22/2016 0831   ALKPHOS 72 11/22/2021 1457   ALKPHOS 85  08/22/2016 0831   AST 22 11/22/2021 1457   AST 29 08/22/2016 0831   ALT 18 11/22/2021 1457   ALT 29 08/22/2016 0831   BILITOT 0.4 11/22/2021 1457   BILITOT 0.35 08/22/2016 0831       No results found for: "LABCA2"  No components found for: "NLGXQJ194"  No results for input(s): "INR" in the last 168 hours.  Urinalysis No results found for: "COLORURINE", "APPEARANCEUR", "LABSPEC", "PHURINE", "GLUCOSEU", "HGBUR", "BILIRUBINUR", "KETONESUR", "PROTEINUR", "UROBILINOGEN", "NITRITE", "LEUKOCYTESUR"   STUDIES: No results found.   ELIGIBLE FOR AVAILABLE RESEARCH PROTOCOL: No  ASSESSMENT: 71 y.o. [place] woman status post right breast lower inner quadrant biopsy 08/13/2016 for a clinical T1a N0, stage IA invasive ductal carcinoma, grade 1, estrogen and progesterone receptor positive, HER-2 not amplified, with an MIB-1 of 2%  (1) status post right lumpectomy and sentinel lymph node sampling 09/03/2016 for a pT1b pN0, stage IA invasive ductal carcinoma, grade 1, with a broadly positive anterior margin   (a) margins cleared with additional surgery 09/11/2016  (2) adjuvant radiation6/20/2018 to 11/07/2016  Site/dose:    1. The Right breast was treated to 42.5 Gy in 17 fractions at 2.5 Gy per fraction. 2. The Right breast was boosted to 7.5 Gy in 3 fractions at 2.5 Gy per fraction.   (3) Tamoxifen started 12/25/2016   PLAN:  Ms. Hams is here for follow-up on adjuvant tamoxifen.   She will complete 5 years of antiestrogen therapy in September 2023. She just has 2 to 3 pills left in the current prescription.  Okay to discontinue tamoxifen after this prescription. We made this telephone appointment to discuss [place] results. [place] didn't show any concerns for malignancy. We previously discussed about continuing screening mammogram annually and SBE monthly and returning to oncology PRN if her [place] is negative. I called her to convey these results but she didn't answer I discussed with  our nurse to contact her as well. Total time spent: 8 minutes  *Total Encounter Time as defined by the Centers for Medicare and Medicaid Services includes, in addition to the face-to-face time of a patient visit (documented in the note above) non-face-to-face time: obtaining and reviewing outside history, ordering and reviewing medications, tests or procedures, care coordination (communications with other health care  professionals or caregivers) and documentation in the medical record.

## 2021-12-26 ENCOUNTER — Encounter: Payer: Self-pay | Admitting: Hematology and Oncology

## 2021-12-28 DIAGNOSIS — M542 Cervicalgia: Secondary | ICD-10-CM | POA: Diagnosis not present

## 2022-01-03 DIAGNOSIS — M542 Cervicalgia: Secondary | ICD-10-CM | POA: Diagnosis not present

## 2022-01-09 DIAGNOSIS — M542 Cervicalgia: Secondary | ICD-10-CM | POA: Diagnosis not present

## 2022-01-25 DIAGNOSIS — M5137 Other intervertebral disc degeneration, lumbosacral region: Secondary | ICD-10-CM | POA: Diagnosis not present

## 2022-01-25 DIAGNOSIS — M9903 Segmental and somatic dysfunction of lumbar region: Secondary | ICD-10-CM | POA: Diagnosis not present

## 2022-01-25 DIAGNOSIS — M50322 Other cervical disc degeneration at C5-C6 level: Secondary | ICD-10-CM | POA: Diagnosis not present

## 2022-01-25 DIAGNOSIS — M9901 Segmental and somatic dysfunction of cervical region: Secondary | ICD-10-CM | POA: Diagnosis not present

## 2022-02-01 DIAGNOSIS — M9901 Segmental and somatic dysfunction of cervical region: Secondary | ICD-10-CM | POA: Diagnosis not present

## 2022-02-01 DIAGNOSIS — M50322 Other cervical disc degeneration at C5-C6 level: Secondary | ICD-10-CM | POA: Diagnosis not present

## 2022-02-01 DIAGNOSIS — M5137 Other intervertebral disc degeneration, lumbosacral region: Secondary | ICD-10-CM | POA: Diagnosis not present

## 2022-02-01 DIAGNOSIS — M9903 Segmental and somatic dysfunction of lumbar region: Secondary | ICD-10-CM | POA: Diagnosis not present

## 2022-02-06 DIAGNOSIS — M5137 Other intervertebral disc degeneration, lumbosacral region: Secondary | ICD-10-CM | POA: Diagnosis not present

## 2022-02-06 DIAGNOSIS — M9901 Segmental and somatic dysfunction of cervical region: Secondary | ICD-10-CM | POA: Diagnosis not present

## 2022-02-06 DIAGNOSIS — M9903 Segmental and somatic dysfunction of lumbar region: Secondary | ICD-10-CM | POA: Diagnosis not present

## 2022-02-06 DIAGNOSIS — M50322 Other cervical disc degeneration at C5-C6 level: Secondary | ICD-10-CM | POA: Diagnosis not present

## 2022-02-15 DIAGNOSIS — M9901 Segmental and somatic dysfunction of cervical region: Secondary | ICD-10-CM | POA: Diagnosis not present

## 2022-02-15 DIAGNOSIS — M5137 Other intervertebral disc degeneration, lumbosacral region: Secondary | ICD-10-CM | POA: Diagnosis not present

## 2022-02-15 DIAGNOSIS — M50322 Other cervical disc degeneration at C5-C6 level: Secondary | ICD-10-CM | POA: Diagnosis not present

## 2022-02-15 DIAGNOSIS — M9903 Segmental and somatic dysfunction of lumbar region: Secondary | ICD-10-CM | POA: Diagnosis not present

## 2022-02-20 DIAGNOSIS — M50322 Other cervical disc degeneration at C5-C6 level: Secondary | ICD-10-CM | POA: Diagnosis not present

## 2022-02-20 DIAGNOSIS — M5137 Other intervertebral disc degeneration, lumbosacral region: Secondary | ICD-10-CM | POA: Diagnosis not present

## 2022-02-20 DIAGNOSIS — M9901 Segmental and somatic dysfunction of cervical region: Secondary | ICD-10-CM | POA: Diagnosis not present

## 2022-02-20 DIAGNOSIS — M9903 Segmental and somatic dysfunction of lumbar region: Secondary | ICD-10-CM | POA: Diagnosis not present

## 2022-02-26 DIAGNOSIS — M5136 Other intervertebral disc degeneration, lumbar region: Secondary | ICD-10-CM | POA: Diagnosis not present

## 2022-02-26 DIAGNOSIS — M5032 Other cervical disc degeneration, mid-cervical region, unspecified level: Secondary | ICD-10-CM | POA: Diagnosis not present

## 2022-02-28 DIAGNOSIS — M50322 Other cervical disc degeneration at C5-C6 level: Secondary | ICD-10-CM | POA: Diagnosis not present

## 2022-02-28 DIAGNOSIS — M9901 Segmental and somatic dysfunction of cervical region: Secondary | ICD-10-CM | POA: Diagnosis not present

## 2022-02-28 DIAGNOSIS — H401211 Low-tension glaucoma, right eye, mild stage: Secondary | ICD-10-CM | POA: Diagnosis not present

## 2022-02-28 DIAGNOSIS — H5213 Myopia, bilateral: Secondary | ICD-10-CM | POA: Diagnosis not present

## 2022-02-28 DIAGNOSIS — M9903 Segmental and somatic dysfunction of lumbar region: Secondary | ICD-10-CM | POA: Diagnosis not present

## 2022-02-28 DIAGNOSIS — M5137 Other intervertebral disc degeneration, lumbosacral region: Secondary | ICD-10-CM | POA: Diagnosis not present

## 2022-03-08 DIAGNOSIS — H401111 Primary open-angle glaucoma, right eye, mild stage: Secondary | ICD-10-CM | POA: Diagnosis not present

## 2022-03-12 DIAGNOSIS — M5137 Other intervertebral disc degeneration, lumbosacral region: Secondary | ICD-10-CM | POA: Diagnosis not present

## 2022-03-12 DIAGNOSIS — M9903 Segmental and somatic dysfunction of lumbar region: Secondary | ICD-10-CM | POA: Diagnosis not present

## 2022-03-12 DIAGNOSIS — M9901 Segmental and somatic dysfunction of cervical region: Secondary | ICD-10-CM | POA: Diagnosis not present

## 2022-03-12 DIAGNOSIS — M50322 Other cervical disc degeneration at C5-C6 level: Secondary | ICD-10-CM | POA: Diagnosis not present

## 2022-03-22 DIAGNOSIS — M9901 Segmental and somatic dysfunction of cervical region: Secondary | ICD-10-CM | POA: Diagnosis not present

## 2022-03-22 DIAGNOSIS — M9903 Segmental and somatic dysfunction of lumbar region: Secondary | ICD-10-CM | POA: Diagnosis not present

## 2022-03-22 DIAGNOSIS — M50322 Other cervical disc degeneration at C5-C6 level: Secondary | ICD-10-CM | POA: Diagnosis not present

## 2022-03-22 DIAGNOSIS — M5137 Other intervertebral disc degeneration, lumbosacral region: Secondary | ICD-10-CM | POA: Diagnosis not present

## 2022-03-28 DIAGNOSIS — M9901 Segmental and somatic dysfunction of cervical region: Secondary | ICD-10-CM | POA: Diagnosis not present

## 2022-03-28 DIAGNOSIS — M5137 Other intervertebral disc degeneration, lumbosacral region: Secondary | ICD-10-CM | POA: Diagnosis not present

## 2022-03-28 DIAGNOSIS — M9903 Segmental and somatic dysfunction of lumbar region: Secondary | ICD-10-CM | POA: Diagnosis not present

## 2022-03-28 DIAGNOSIS — M50322 Other cervical disc degeneration at C5-C6 level: Secondary | ICD-10-CM | POA: Diagnosis not present

## 2022-04-02 DIAGNOSIS — M9903 Segmental and somatic dysfunction of lumbar region: Secondary | ICD-10-CM | POA: Diagnosis not present

## 2022-04-02 DIAGNOSIS — M5137 Other intervertebral disc degeneration, lumbosacral region: Secondary | ICD-10-CM | POA: Diagnosis not present

## 2022-04-02 DIAGNOSIS — M9901 Segmental and somatic dysfunction of cervical region: Secondary | ICD-10-CM | POA: Diagnosis not present

## 2022-04-02 DIAGNOSIS — M50322 Other cervical disc degeneration at C5-C6 level: Secondary | ICD-10-CM | POA: Diagnosis not present

## 2022-04-05 DIAGNOSIS — H2513 Age-related nuclear cataract, bilateral: Secondary | ICD-10-CM | POA: Diagnosis not present

## 2022-04-05 DIAGNOSIS — H401132 Primary open-angle glaucoma, bilateral, moderate stage: Secondary | ICD-10-CM | POA: Diagnosis not present

## 2022-04-25 DIAGNOSIS — M5137 Other intervertebral disc degeneration, lumbosacral region: Secondary | ICD-10-CM | POA: Diagnosis not present

## 2022-04-25 DIAGNOSIS — M50322 Other cervical disc degeneration at C5-C6 level: Secondary | ICD-10-CM | POA: Diagnosis not present

## 2022-04-25 DIAGNOSIS — M9903 Segmental and somatic dysfunction of lumbar region: Secondary | ICD-10-CM | POA: Diagnosis not present

## 2022-04-25 DIAGNOSIS — M9901 Segmental and somatic dysfunction of cervical region: Secondary | ICD-10-CM | POA: Diagnosis not present

## 2022-05-09 DIAGNOSIS — M5137 Other intervertebral disc degeneration, lumbosacral region: Secondary | ICD-10-CM | POA: Diagnosis not present

## 2022-05-09 DIAGNOSIS — M9903 Segmental and somatic dysfunction of lumbar region: Secondary | ICD-10-CM | POA: Diagnosis not present

## 2022-05-09 DIAGNOSIS — M50322 Other cervical disc degeneration at C5-C6 level: Secondary | ICD-10-CM | POA: Diagnosis not present

## 2022-05-09 DIAGNOSIS — M9901 Segmental and somatic dysfunction of cervical region: Secondary | ICD-10-CM | POA: Diagnosis not present

## 2022-05-22 DIAGNOSIS — R7301 Impaired fasting glucose: Secondary | ICD-10-CM | POA: Diagnosis not present

## 2022-05-22 DIAGNOSIS — M810 Age-related osteoporosis without current pathological fracture: Secondary | ICD-10-CM | POA: Diagnosis not present

## 2022-05-22 DIAGNOSIS — E785 Hyperlipidemia, unspecified: Secondary | ICD-10-CM | POA: Diagnosis not present

## 2022-05-30 DIAGNOSIS — M9901 Segmental and somatic dysfunction of cervical region: Secondary | ICD-10-CM | POA: Diagnosis not present

## 2022-05-30 DIAGNOSIS — E785 Hyperlipidemia, unspecified: Secondary | ICD-10-CM | POA: Diagnosis not present

## 2022-05-30 DIAGNOSIS — G479 Sleep disorder, unspecified: Secondary | ICD-10-CM | POA: Diagnosis not present

## 2022-05-30 DIAGNOSIS — Z6824 Body mass index (BMI) 24.0-24.9, adult: Secondary | ICD-10-CM | POA: Diagnosis not present

## 2022-05-30 DIAGNOSIS — M5137 Other intervertebral disc degeneration, lumbosacral region: Secondary | ICD-10-CM | POA: Diagnosis not present

## 2022-05-30 DIAGNOSIS — M79645 Pain in left finger(s): Secondary | ICD-10-CM | POA: Diagnosis not present

## 2022-05-30 DIAGNOSIS — M9903 Segmental and somatic dysfunction of lumbar region: Secondary | ICD-10-CM | POA: Diagnosis not present

## 2022-05-30 DIAGNOSIS — M50322 Other cervical disc degeneration at C5-C6 level: Secondary | ICD-10-CM | POA: Diagnosis not present

## 2022-05-30 DIAGNOSIS — R7303 Prediabetes: Secondary | ICD-10-CM | POA: Diagnosis not present

## 2022-05-30 DIAGNOSIS — M85852 Other specified disorders of bone density and structure, left thigh: Secondary | ICD-10-CM | POA: Diagnosis not present

## 2022-06-06 DIAGNOSIS — M1812 Unilateral primary osteoarthritis of first carpometacarpal joint, left hand: Secondary | ICD-10-CM | POA: Diagnosis not present

## 2022-06-06 DIAGNOSIS — M189 Osteoarthritis of first carpometacarpal joint, unspecified: Secondary | ICD-10-CM | POA: Insufficient documentation

## 2022-06-06 DIAGNOSIS — M79642 Pain in left hand: Secondary | ICD-10-CM | POA: Insufficient documentation

## 2022-06-19 DIAGNOSIS — M9903 Segmental and somatic dysfunction of lumbar region: Secondary | ICD-10-CM | POA: Diagnosis not present

## 2022-06-19 DIAGNOSIS — M50322 Other cervical disc degeneration at C5-C6 level: Secondary | ICD-10-CM | POA: Diagnosis not present

## 2022-06-19 DIAGNOSIS — M9901 Segmental and somatic dysfunction of cervical region: Secondary | ICD-10-CM | POA: Diagnosis not present

## 2022-06-19 DIAGNOSIS — M5137 Other intervertebral disc degeneration, lumbosacral region: Secondary | ICD-10-CM | POA: Diagnosis not present

## 2022-06-27 DIAGNOSIS — M5137 Other intervertebral disc degeneration, lumbosacral region: Secondary | ICD-10-CM | POA: Diagnosis not present

## 2022-06-27 DIAGNOSIS — M9901 Segmental and somatic dysfunction of cervical region: Secondary | ICD-10-CM | POA: Diagnosis not present

## 2022-06-27 DIAGNOSIS — M9903 Segmental and somatic dysfunction of lumbar region: Secondary | ICD-10-CM | POA: Diagnosis not present

## 2022-06-27 DIAGNOSIS — M50322 Other cervical disc degeneration at C5-C6 level: Secondary | ICD-10-CM | POA: Diagnosis not present

## 2022-07-17 DIAGNOSIS — M5137 Other intervertebral disc degeneration, lumbosacral region: Secondary | ICD-10-CM | POA: Diagnosis not present

## 2022-07-17 DIAGNOSIS — M9901 Segmental and somatic dysfunction of cervical region: Secondary | ICD-10-CM | POA: Diagnosis not present

## 2022-07-17 DIAGNOSIS — M9903 Segmental and somatic dysfunction of lumbar region: Secondary | ICD-10-CM | POA: Diagnosis not present

## 2022-07-17 DIAGNOSIS — M50322 Other cervical disc degeneration at C5-C6 level: Secondary | ICD-10-CM | POA: Diagnosis not present

## 2022-08-13 DIAGNOSIS — H401112 Primary open-angle glaucoma, right eye, moderate stage: Secondary | ICD-10-CM | POA: Diagnosis not present

## 2022-08-29 DIAGNOSIS — R42 Dizziness and giddiness: Secondary | ICD-10-CM | POA: Diagnosis not present

## 2022-08-29 DIAGNOSIS — G4700 Insomnia, unspecified: Secondary | ICD-10-CM | POA: Diagnosis not present

## 2022-10-11 ENCOUNTER — Other Ambulatory Visit: Payer: Self-pay | Admitting: Hematology and Oncology

## 2022-11-05 DIAGNOSIS — Z1231 Encounter for screening mammogram for malignant neoplasm of breast: Secondary | ICD-10-CM | POA: Diagnosis not present

## 2022-11-06 ENCOUNTER — Encounter: Payer: Self-pay | Admitting: Hematology and Oncology

## 2022-11-06 ENCOUNTER — Telehealth: Payer: Self-pay

## 2022-11-06 NOTE — Telephone Encounter (Signed)
Called and LVM with recent screening MM results as negative for malignancy. Also gave below message listed in latest MD note:  "Ms. Jalbert is here for follow-up on adjuvant tamoxifen.   She will complete 5 years of antiestrogen therapy in September 2023. She just has 2 to 3 pills left in the current prescription.  Okay to discontinue tamoxifen after this prescription. We made this telephone appointment to discuss [place] results. [place] didn't show any concerns for malignancy. We previously discussed about continuing screening mammogram annually and SBE monthly and returning to oncology PRN if her [place] is negative. I called her to convey these results but she didn't answer I discussed with our nurse to contact her as well."  Gave call back number with any questions.

## 2022-11-08 DIAGNOSIS — Z0000 Encounter for general adult medical examination without abnormal findings: Secondary | ICD-10-CM | POA: Diagnosis not present

## 2022-11-21 DIAGNOSIS — Z7184 Encounter for health counseling related to travel: Secondary | ICD-10-CM | POA: Diagnosis not present

## 2022-11-21 DIAGNOSIS — Z6824 Body mass index (BMI) 24.0-24.9, adult: Secondary | ICD-10-CM | POA: Diagnosis not present

## 2022-11-21 DIAGNOSIS — R300 Dysuria: Secondary | ICD-10-CM | POA: Diagnosis not present

## 2022-11-21 DIAGNOSIS — R1030 Lower abdominal pain, unspecified: Secondary | ICD-10-CM | POA: Diagnosis not present

## 2022-11-22 DIAGNOSIS — M1812 Unilateral primary osteoarthritis of first carpometacarpal joint, left hand: Secondary | ICD-10-CM | POA: Diagnosis not present

## 2022-11-27 DIAGNOSIS — R7303 Prediabetes: Secondary | ICD-10-CM | POA: Diagnosis not present

## 2022-11-27 DIAGNOSIS — E785 Hyperlipidemia, unspecified: Secondary | ICD-10-CM | POA: Diagnosis not present

## 2022-11-29 DIAGNOSIS — L723 Sebaceous cyst: Secondary | ICD-10-CM | POA: Diagnosis not present

## 2022-12-31 DIAGNOSIS — H2513 Age-related nuclear cataract, bilateral: Secondary | ICD-10-CM | POA: Diagnosis not present

## 2022-12-31 DIAGNOSIS — H401111 Primary open-angle glaucoma, right eye, mild stage: Secondary | ICD-10-CM | POA: Diagnosis not present

## 2023-03-05 ENCOUNTER — Ambulatory Visit: Payer: Medicare Other | Admitting: Plastic Surgery

## 2023-03-05 VITALS — BP 111/74 | HR 82 | Ht 63.0 in | Wt 138.8 lb

## 2023-03-05 DIAGNOSIS — L723 Sebaceous cyst: Secondary | ICD-10-CM | POA: Insufficient documentation

## 2023-03-05 NOTE — Progress Notes (Signed)
Patient ID: [name], female    DOB: 06-13-1950, 72 y.o.   MRN: [id number]   Chief Complaint  Patient presents with   Advice Only   Skin Problem    The patient is a 72 year old female here for evaluation of her face.  The patient states that she has had a cyst on her left cheek for several years.  It comes and goes.  It gets bigger and sometimes drains.  Now it seems to be stable without any drainage but is getting larger.  Sometimes it is tender and sometimes it gets irritated.  Its about 1-1/2 cm in size.  She is otherwise in good health and does not have any skin lesions of concern.    Review of Systems  Constitutional: Negative.   HENT: Negative.    Eyes: Negative.   Respiratory: Negative.    Cardiovascular: Negative.   Gastrointestinal: Negative.   Endocrine: Negative.   Genitourinary: Negative.     Past Medical History:  Diagnosis Date   Arthritis    neck   Cancer ([facility]) 07/2016   right breast cancer   Depression    Family history of adverse reaction to anesthesia    mother had cardiac arrest and PONV   Headache    migraine   History of kidney stones    No pertinent past medical history     Past Surgical History:  Procedure Laterality Date   BACK SURGERY     diskectomy L5-6   BREAST LUMPECTOMY WITH RADIOACTIVE SEED AND SENTINEL LYMPH NODE BIOPSY Right 09/03/2016   Procedure: RIGHT BREAST LUMPECTOMY WITH RADIOACTIVE SEED AND RIGHT AXILLARY SENTINEL NODE BIOPSY;  Surgeon: Emelia Loron, MD;  Location: [facility];  Service: General;  Laterality: Right;   CESAREAN SECTION     triplets   DILATION AND CURETTAGE OF UTERUS     KNEE ARTHROSCOPY Right    RE-EXCISION OF BREAST LUMPECTOMY Right 09/11/2016   Procedure: RIGHT RE-EXCISION OF BREAST LUMPECTOMY;  Surgeon: Emelia Loron, MD;  Location: [facility];  Service: General;  Laterality: Right;   TOTAL KNEE ARTHROPLASTY Right 03/16/2019   Procedure: TOTAL KNEE ARTHROPLASTY;  Surgeon: Ollen Gross, MD;  Location: [facility];  Service: Orthopedics;  Laterality: Right;       Current Outpatient Medications:    atorvastatin (LIPITOR) 10 MG tablet, Take 10 mg by mouth daily., Disp: , Rfl:    clonazePAM (KLONOPIN) 0.5 MG tablet, TAKE 1-2 TABLETS BY MOUTH AT BEDTIME AS NEEDED FOR SLEEP (Patient taking differently: Take 0.5-1 mg by mouth at bedtime.), Disp: 60 tablet, Rfl: 0   gabapentin (NEURONTIN) 300 MG capsule, TAKE 2 CAPSULES BY MOUTH AT  BEDTIME, Disp: 200 capsule, Rfl: 2   latanoprost (XALATAN) 0.005 % ophthalmic solution, Place 1 drop into both eyes at bedtime., Disp: , Rfl:    sertraline (ZOLOFT) 100 MG tablet, Take 150 mg by mouth daily., Disp: , Rfl:    Objective:   Vitals:   03/05/23 0840  BP: 111/74  Pulse: 82  SpO2: 94%    Physical Exam Vitals reviewed.  Constitutional:      Appearance: Normal appearance.  HENT:     Head: Atraumatic.  Cardiovascular:     Rate and Rhythm: Normal rate.     Pulses: Normal pulses.  Pulmonary:     Effort: Pulmonary effort is normal.  Musculoskeletal:        General: Tenderness present. No swelling.  Skin:    General: Skin is warm.  Capillary Refill: Capillary refill takes less than 2 seconds.     Coloration: Skin is not jaundiced or pale.     Findings: Lesion present. No bruising or erythema.  Neurological:     Mental Status: She is alert and oriented to person, place, and time.  Psychiatric:        Mood and Affect: Mood normal.        Behavior: Behavior normal.        Thought Content: Thought content normal.     Assessment & Plan:  Sebaceous cyst  Plan for excision of left cheek sebaceous cyst in the office.  She will have a scar.  We discussed this.  Patient would like it done as soon as possible.  It is also a chance of recurrence.  Pictures were obtained of the patient and placed in the chart with the patient's or guardian's permission.   Alena Bills Porschea Borys, DO

## 2023-04-25 DIAGNOSIS — M4722 Other spondylosis with radiculopathy, cervical region: Secondary | ICD-10-CM | POA: Diagnosis not present

## 2023-04-25 DIAGNOSIS — M542 Cervicalgia: Secondary | ICD-10-CM | POA: Diagnosis not present

## 2023-05-01 DIAGNOSIS — H2513 Age-related nuclear cataract, bilateral: Secondary | ICD-10-CM | POA: Diagnosis not present

## 2023-05-01 DIAGNOSIS — H401131 Primary open-angle glaucoma, bilateral, mild stage: Secondary | ICD-10-CM | POA: Diagnosis not present

## 2023-05-10 DIAGNOSIS — M5412 Radiculopathy, cervical region: Secondary | ICD-10-CM | POA: Diagnosis not present

## 2023-05-14 DIAGNOSIS — M5412 Radiculopathy, cervical region: Secondary | ICD-10-CM | POA: Diagnosis not present

## 2023-05-17 ENCOUNTER — Encounter: Payer: Self-pay | Admitting: Plastic Surgery

## 2023-05-17 ENCOUNTER — Ambulatory Visit: Payer: Medicare Other | Admitting: Plastic Surgery

## 2023-05-17 VITALS — BP 104/68 | HR 96 | Ht 63.0 in | Wt 142.0 lb

## 2023-05-17 DIAGNOSIS — L723 Sebaceous cyst: Secondary | ICD-10-CM

## 2023-05-17 DIAGNOSIS — M5412 Radiculopathy, cervical region: Secondary | ICD-10-CM | POA: Diagnosis not present

## 2023-05-17 NOTE — Progress Notes (Addendum)
Procedure Note  Preoperative Dx: sebaceous cyst of face  Postoperative Dx: Same  Procedure: Excision of sebaceous cyst of right face 1 cm  Anesthesia: Lidocaine 1% with 1:100,000 epinephrine  Indication for Procedure: cyst  Description of Procedure: Risks and complications were explained to the patient.  Consent was confirmed and the patient understands the risks and benefits.  The potential complications and alternatives were explained and the patient consents.  The patient expressed understanding the option of not having the procedure and the risks of a scar.  Time out was called and all information was confirmed to be correct.    The area was prepped and drapped.  Lidocaine 1% with epinephrine was injected in the subcutaneous area.  After waiting several minutes for the local to take affect a #15 blade was used to incise the skin over the area. The tissue scissors were use to dissect around the cyst and remove it completely.  The skin edges were reapproximated with 5-0 Monocryl.  A dressing was applied.  The patient was given instructions on how to care for the area and a follow up appointment.  [name] tolerated the procedure well and there were no complications.

## 2023-05-21 DIAGNOSIS — M5412 Radiculopathy, cervical region: Secondary | ICD-10-CM | POA: Diagnosis not present

## 2023-05-24 DIAGNOSIS — M5412 Radiculopathy, cervical region: Secondary | ICD-10-CM | POA: Diagnosis not present

## 2023-05-27 ENCOUNTER — Encounter: Payer: Self-pay | Admitting: Student

## 2023-05-27 ENCOUNTER — Ambulatory Visit: Payer: Medicare Other | Admitting: Student

## 2023-05-27 VITALS — BP 121/75 | HR 89 | Ht 63.0 in | Wt 140.0 lb

## 2023-05-27 DIAGNOSIS — M5412 Radiculopathy, cervical region: Secondary | ICD-10-CM | POA: Diagnosis not present

## 2023-05-27 DIAGNOSIS — L723 Sebaceous cyst: Secondary | ICD-10-CM

## 2023-05-27 NOTE — Progress Notes (Cosign Needed)
Patient is a 73 year old female who underwent excision of a cyst on her right face with Dr. Ulice Bold on 05/17/2023.  She is 10 days out from her procedure.  She presents to the clinic today for postprocedural follow-up.  Today, patient reports she is doing well.  She denies any issues or concerns with the surgical site.  Denies any drainage, fevers or chills.  On exam, patient is sitting upright in no acute distress.  Steri-Strip was removed.  Incision is intact and healing well.  There is no surrounding erythema, swelling or drainage.  Monocryl suture was noted.  This was cut and removed.  Patient tolerated well.  Recommended that patient apply Vaseline to her incision daily for the next week.  Discussed with her after 1 week, she may start applying silicone-based scar creams to the incision daily.  We discussed the importance of keeping the incision out of direct sunlight as this can worsen the scar.  Patient expressed understanding.  Patient to follow-up as needed.  Instructed to call if she has any questions or concerns about anything.

## 2023-05-29 DIAGNOSIS — Z853 Personal history of malignant neoplasm of breast: Secondary | ICD-10-CM | POA: Diagnosis not present

## 2023-05-29 DIAGNOSIS — M85852 Other specified disorders of bone density and structure, left thigh: Secondary | ICD-10-CM | POA: Diagnosis not present

## 2023-05-29 DIAGNOSIS — Z6824 Body mass index (BMI) 24.0-24.9, adult: Secondary | ICD-10-CM | POA: Diagnosis not present

## 2023-05-29 DIAGNOSIS — E785 Hyperlipidemia, unspecified: Secondary | ICD-10-CM | POA: Diagnosis not present

## 2023-05-29 DIAGNOSIS — R7303 Prediabetes: Secondary | ICD-10-CM | POA: Diagnosis not present

## 2023-05-29 DIAGNOSIS — G479 Sleep disorder, unspecified: Secondary | ICD-10-CM | POA: Diagnosis not present

## 2023-05-30 DIAGNOSIS — M5412 Radiculopathy, cervical region: Secondary | ICD-10-CM | POA: Diagnosis not present

## 2023-06-03 DIAGNOSIS — M5412 Radiculopathy, cervical region: Secondary | ICD-10-CM | POA: Diagnosis not present

## 2023-06-06 DIAGNOSIS — M5412 Radiculopathy, cervical region: Secondary | ICD-10-CM | POA: Diagnosis not present

## 2023-06-26 ENCOUNTER — Other Ambulatory Visit: Payer: Self-pay | Admitting: Hematology and Oncology

## 2023-06-26 DIAGNOSIS — M5412 Radiculopathy, cervical region: Secondary | ICD-10-CM | POA: Diagnosis not present

## 2023-07-01 DIAGNOSIS — M5412 Radiculopathy, cervical region: Secondary | ICD-10-CM | POA: Diagnosis not present

## 2023-07-02 DIAGNOSIS — M8588 Other specified disorders of bone density and structure, other site: Secondary | ICD-10-CM | POA: Diagnosis not present

## 2023-07-05 DIAGNOSIS — M5412 Radiculopathy, cervical region: Secondary | ICD-10-CM | POA: Diagnosis not present

## 2023-07-09 DIAGNOSIS — M542 Cervicalgia: Secondary | ICD-10-CM | POA: Diagnosis not present

## 2023-07-09 DIAGNOSIS — M4722 Other spondylosis with radiculopathy, cervical region: Secondary | ICD-10-CM | POA: Diagnosis not present

## 2023-08-05 DIAGNOSIS — M810 Age-related osteoporosis without current pathological fracture: Secondary | ICD-10-CM | POA: Diagnosis not present

## 2023-08-05 DIAGNOSIS — Z6824 Body mass index (BMI) 24.0-24.9, adult: Secondary | ICD-10-CM | POA: Diagnosis not present

## 2023-08-30 DIAGNOSIS — H2513 Age-related nuclear cataract, bilateral: Secondary | ICD-10-CM | POA: Diagnosis not present

## 2023-08-30 DIAGNOSIS — H401111 Primary open-angle glaucoma, right eye, mild stage: Secondary | ICD-10-CM | POA: Diagnosis not present

## 2023-11-11 DIAGNOSIS — Z23 Encounter for immunization: Secondary | ICD-10-CM | POA: Diagnosis not present

## 2023-11-11 DIAGNOSIS — Z0000 Encounter for general adult medical examination without abnormal findings: Secondary | ICD-10-CM | POA: Diagnosis not present

## 2023-11-11 DIAGNOSIS — Z1231 Encounter for screening mammogram for malignant neoplasm of breast: Secondary | ICD-10-CM | POA: Diagnosis not present

## 2023-11-25 DIAGNOSIS — M1812 Unilateral primary osteoarthritis of first carpometacarpal joint, left hand: Secondary | ICD-10-CM | POA: Diagnosis not present

## 2023-12-04 DIAGNOSIS — R7303 Prediabetes: Secondary | ICD-10-CM | POA: Diagnosis not present

## 2023-12-04 DIAGNOSIS — E785 Hyperlipidemia, unspecified: Secondary | ICD-10-CM | POA: Diagnosis not present

## 2023-12-04 DIAGNOSIS — M85852 Other specified disorders of bone density and structure, left thigh: Secondary | ICD-10-CM | POA: Diagnosis not present

## 2023-12-12 ENCOUNTER — Other Ambulatory Visit (HOSPITAL_COMMUNITY): Payer: Self-pay | Admitting: Family Medicine

## 2023-12-12 DIAGNOSIS — M810 Age-related osteoporosis without current pathological fracture: Secondary | ICD-10-CM | POA: Diagnosis not present

## 2023-12-12 DIAGNOSIS — R7303 Prediabetes: Secondary | ICD-10-CM | POA: Diagnosis not present

## 2023-12-12 DIAGNOSIS — E785 Hyperlipidemia, unspecified: Secondary | ICD-10-CM | POA: Diagnosis not present

## 2023-12-12 DIAGNOSIS — M1990 Unspecified osteoarthritis, unspecified site: Secondary | ICD-10-CM | POA: Diagnosis not present

## 2024-01-07 ENCOUNTER — Other Ambulatory Visit (HOSPITAL_COMMUNITY)

## 2024-01-09 ENCOUNTER — Ambulatory Visit (HOSPITAL_COMMUNITY)
Admission: RE | Admit: 2024-01-09 | Discharge: 2024-01-09 | Disposition: A | Discharge status: Home / Self Care | Payer: Self-pay | Source: Ambulatory Visit | Attending: Family Medicine | Admitting: Family Medicine

## 2024-01-09 DIAGNOSIS — E785 Hyperlipidemia, unspecified: Secondary | ICD-10-CM | POA: Insufficient documentation

## 2024-01-10 DIAGNOSIS — G8918 Other acute postprocedural pain: Secondary | ICD-10-CM | POA: Diagnosis not present

## 2024-01-10 DIAGNOSIS — M1812 Unilateral primary osteoarthritis of first carpometacarpal joint, left hand: Secondary | ICD-10-CM | POA: Diagnosis not present

## 2024-01-22 DIAGNOSIS — M1812 Unilateral primary osteoarthritis of first carpometacarpal joint, left hand: Secondary | ICD-10-CM | POA: Diagnosis not present

## 2024-01-22 DIAGNOSIS — M25642 Stiffness of left hand, not elsewhere classified: Secondary | ICD-10-CM | POA: Diagnosis not present

## 2024-01-30 DIAGNOSIS — H401111 Primary open-angle glaucoma, right eye, mild stage: Secondary | ICD-10-CM | POA: Diagnosis not present

## 2024-01-30 DIAGNOSIS — H5212 Myopia, left eye: Secondary | ICD-10-CM | POA: Diagnosis not present

## 2024-02-05 DIAGNOSIS — M25642 Stiffness of left hand, not elsewhere classified: Secondary | ICD-10-CM | POA: Diagnosis not present

## 2024-02-12 DIAGNOSIS — M25642 Stiffness of left hand, not elsewhere classified: Secondary | ICD-10-CM | POA: Diagnosis not present

## 2024-03-07 ENCOUNTER — Other Ambulatory Visit: Payer: Self-pay | Admitting: Hematology and Oncology
